# Patient Record
Sex: Male | Born: 1950
Health system: Southern US, Community
[De-identification: ages and names within clinical notes are randomized; demographics above are authoritative.]

## PROBLEM LIST (undated history)

## (undated) DIAGNOSIS — Z8371 Family history of colonic polyps: Secondary | ICD-10-CM

## (undated) DIAGNOSIS — Z83719 Family history of colon polyps, unspecified: Secondary | ICD-10-CM

## (undated) DIAGNOSIS — H409 Unspecified glaucoma: Secondary | ICD-10-CM

## (undated) DIAGNOSIS — S42309A Unspecified fracture of shaft of humerus, unspecified arm, initial encounter for closed fracture: Secondary | ICD-10-CM

## (undated) DIAGNOSIS — E785 Hyperlipidemia, unspecified: Secondary | ICD-10-CM

## (undated) DIAGNOSIS — H269 Unspecified cataract: Secondary | ICD-10-CM

## (undated) DIAGNOSIS — C4359 Malignant melanoma of other part of trunk: Secondary | ICD-10-CM

## (undated) HISTORY — DX: Unspecified glaucoma: H40.9

## (undated) HISTORY — DX: Malignant melanoma of other part of trunk: C43.59

## (undated) HISTORY — DX: Family history of colonic polyps: Z83.71

## (undated) HISTORY — DX: Unspecified cataract: H26.9

## (undated) HISTORY — DX: Unspecified fracture of shaft of humerus, unspecified arm, initial encounter for closed fracture: S42.309A

## (undated) HISTORY — DX: Family history of colon polyps, unspecified: Z83.719

## (undated) HISTORY — PX: COLONOSCOPY: SHX174

## (undated) HISTORY — DX: Hyperlipidemia, unspecified: E78.5

---

## 1983-06-15 HISTORY — PX: SHOULDER SURGERY: SHX246

## 1988-06-14 DIAGNOSIS — S42309A Unspecified fracture of shaft of humerus, unspecified arm, initial encounter for closed fracture: Secondary | ICD-10-CM

## 1988-06-14 HISTORY — DX: Unspecified fracture of shaft of humerus, unspecified arm, initial encounter for closed fracture: S42.309A

## 2002-02-22 ENCOUNTER — Emergency Department (HOSPITAL_COMMUNITY): Admission: EM | Admit: 2002-02-22 | Discharge: 2002-02-22 | Payer: Self-pay | Admitting: Emergency Medicine

## 2002-02-22 ENCOUNTER — Encounter: Payer: Self-pay | Admitting: Emergency Medicine

## 2004-07-03 ENCOUNTER — Ambulatory Visit: Payer: Self-pay | Admitting: Family Medicine

## 2005-07-08 ENCOUNTER — Ambulatory Visit: Payer: Self-pay | Admitting: Internal Medicine

## 2007-01-04 DIAGNOSIS — Z860100 Personal history of colon polyps, unspecified: Secondary | ICD-10-CM | POA: Insufficient documentation

## 2007-01-04 DIAGNOSIS — E785 Hyperlipidemia, unspecified: Secondary | ICD-10-CM

## 2007-01-10 ENCOUNTER — Ambulatory Visit: Payer: Self-pay | Admitting: Internal Medicine

## 2007-01-10 LAB — CONVERTED CEMR LAB
ALT: 26 units/L (ref 0–53)
Glucose, Bld: 100 mg/dL — ABNORMAL HIGH (ref 70–99)
HDL: 60.9 mg/dL (ref >39.0)
Total CHOL/HDL Ratio: 3.9
Triglycerides: 306 mg/dL (ref 0–149)

## 2008-01-16 ENCOUNTER — Telehealth (INDEPENDENT_AMBULATORY_CARE_PROVIDER_SITE_OTHER): Payer: Self-pay | Admitting: *Deleted

## 2008-06-05 ENCOUNTER — Ambulatory Visit: Payer: Self-pay | Admitting: Internal Medicine

## 2008-06-05 DIAGNOSIS — L570 Actinic keratosis: Secondary | ICD-10-CM

## 2008-06-06 LAB — CONVERTED CEMR LAB
ALT: 25 units/L (ref 0–53)
AST: 25 units/L (ref 0–37)
BUN: 11 mg/dL (ref 6–23)
Bilirubin, Direct: 0.1 mg/dL (ref 0.0–0.3)
Calcium: 9.5 mg/dL (ref 8.4–10.5)
Cholesterol: 205 mg/dL (ref 0–200)
Creatinine, Ser: 0.8 mg/dL (ref 0.4–1.5)
Eosinophils Relative: 5.7 % — ABNORMAL HIGH (ref 0.0–5.0)
GFR calc Af Amer: 128 mL/min
Glucose, Bld: 108 mg/dL — ABNORMAL HIGH (ref 70–99)
HCT: 39.6 % (ref 39.0–52.0)
Hemoglobin: 14.1 g/dL (ref 13.0–17.0)
Lymphocytes Relative: 20.2 % (ref 12.0–46.0)
Monocytes Relative: 6.5 % (ref 3.0–12.0)
Neutro Abs: 4.5 10*3/uL (ref 1.4–7.7)
Phosphorus: 3.8 mg/dL (ref 2.3–4.6)
Platelets: 245 10*3/uL (ref 150–400)
RDW: 12.2 % (ref 11.5–14.6)
Total Bilirubin: 0.8 mg/dL (ref 0.3–1.2)
Total CHOL/HDL Ratio: 3.2
Total Protein: 6.9 g/dL (ref 6.0–8.3)
VLDL: 36 mg/dL (ref 0–40)
WBC: 6.8 10*3/uL (ref 4.5–10.5)

## 2008-06-25 ENCOUNTER — Ambulatory Visit: Payer: Self-pay | Admitting: Gastroenterology

## 2008-07-08 ENCOUNTER — Encounter: Payer: Self-pay | Admitting: Gastroenterology

## 2008-07-08 ENCOUNTER — Ambulatory Visit: Payer: Self-pay | Admitting: Gastroenterology

## 2008-07-09 ENCOUNTER — Encounter: Payer: Self-pay | Admitting: Gastroenterology

## 2009-07-29 ENCOUNTER — Ambulatory Visit: Payer: Self-pay | Admitting: Internal Medicine

## 2009-07-31 LAB — CONVERTED CEMR LAB
AST: 33 units/L (ref 0–37)
BUN: 9 mg/dL (ref 6–23)
Basophils Absolute: 0 10*3/uL (ref 0.0–0.1)
CO2: 30 meq/L (ref 19–32)
Cholesterol: 221 mg/dL — ABNORMAL HIGH (ref 0–200)
Creatinine, Ser: 0.8 mg/dL (ref 0.4–1.5)
Direct LDL: 118.1 mg/dL
GFR calc non Af Amer: 105.38 mL/min (ref >60)
Glucose, Bld: 106 mg/dL — ABNORMAL HIGH (ref 70–99)
HCT: 41.7 % (ref 39.0–52.0)
HDL: 87.1 mg/dL (ref >39.00)
Lymphs Abs: 1.6 10*3/uL (ref 0.7–4.0)
Monocytes Relative: 5.8 % (ref 3.0–12.0)
Neutrophils Relative %: 60.7 % (ref 43.0–77.0)
Phosphorus: 4.2 mg/dL (ref 2.3–4.6)
Platelets: 256 10*3/uL (ref 150.0–400.0)
RDW: 12.8 % (ref 11.5–14.6)
Total Bilirubin: 0.9 mg/dL (ref 0.3–1.2)
Total CHOL/HDL Ratio: 3
Triglycerides: 141 mg/dL (ref 0.0–149.0)
VLDL: 28.2 mg/dL (ref 0.0–40.0)

## 2009-11-28 ENCOUNTER — Ambulatory Visit: Payer: Self-pay | Admitting: Internal Medicine

## 2009-11-28 DIAGNOSIS — M109 Gout, unspecified: Secondary | ICD-10-CM | POA: Insufficient documentation

## 2009-11-29 ENCOUNTER — Ambulatory Visit: Payer: Self-pay | Admitting: Family Medicine

## 2009-11-30 LAB — CONVERTED CEMR LAB
BUN: 13 mg/dL (ref 6–23)
CO2: 28 meq/L (ref 19–32)
Chloride: 105 meq/L (ref 96–112)
GFR calc non Af Amer: 99.5 mL/min (ref >60)
Sodium: 141 meq/L (ref 135–145)
Uric Acid, Serum: 7.4 mg/dL (ref 4.0–7.8)

## 2009-12-01 ENCOUNTER — Telehealth: Payer: Self-pay | Admitting: Internal Medicine
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
## 2010-07-14 NOTE — Assessment & Plan Note (Signed)
Summary: GOUT NOT GETTING BETTER FROM THE MEDS GIVEN 6.17.11/[facility]   Vital Signs:  Patient profile:   60 year old male Weight:      182 pounds Temp:     97.3 degrees F oral Pulse rate:   72 / minute BP sitting:   120 / 82  (left arm)  Vitals Entered By: Doristine Devoid (November 29, 2009 10:18 AM) CC: gout no better in L big toe   CC:  gout no better in L big toe.  History of Present Illness: 60 y/o male dx by dr. Williemae Natter yest. to have acute gout.ist episode...Marland KitchenMarland KitchenMarland Kitchen colcrys .6 three times a day .Marland Kitchen here today b/ it is not 100 % imp.and he has a Cytogeneticist !!!!!!!!!!!!!!!!!!!!!!!!!!!  Allergies: 1)  Flexeril (Cyclobenzaprine Hcl)  Review of Systems      See HPI  Physical Exam  General:  Well-developed,well-nourished,in no acute distress; alert,appropriate and cooperative throughout examination Msk:  red,swollen toe   Impression & Recommendations:  Problem # 1:  GOUT (ICD-274.9) Assessment Improved  His updated medication list for this problem includes:    Colcrys 0.6 Mg Tabs (Colchicine) .Marland Kitchen... 1 tab every 2 hours up to 3 daily for gout attacks  Complete Medication List: 1)  Lipitor 40 Mg Tabs (Atorvastatin calcium) .... Take 1/2 tablet daily 2)  Multivitamins Tabs (Multiple vitamin) .... Take one by mouth once a day 3)  Aspirin 81 Mg Tbec (Aspirin) .... Take one by mouth once a day 4)  Colcrys 0.6 Mg Tabs (Colchicine) .Marland Kitchen.. 1 tab every 2 hours up to 3 daily for gout attacks  Patient Instructions: 1)  elevation/ice today and sun. cont. rx....see pcp as needed..........................Marland Kitchenuric acid level ??????????
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
## 2010-07-14 NOTE — Progress Notes (Signed)
Summary: call a nurse  Phone Note Call from Patient   Summary of Call: Triage Record Num: [id number] Operator: Karenann Cai Patient Name: Navarre Diana Call Date & Time: 11/29/2009 8:42:34AM Patient Phone: [phone] PCP: Tillman Abide Patient Gender: Male PCP Fax : Patient DOB: 1950/08/28 Practice Name: [facility] Reason for Call: Patient is calling to report that patient was seen on 11/28/2009: gout in left foot. Patient has taken 3 doses and no change in symptoms. Patient reports that his symptoms are not improving. RN contacted staff at [facility] to schedule an appointment for today: appointment scheduled for 10:15. RN contacted patient with this appointment time. Protocol(s) Used: Office Note Recommended Outcome per Protocol: Information Noted and Sent to Office Reason for Outcome: Caller information to office Care Advice:  ~ 06/ Initial call taken by: Melody Comas,  December 01, 2009 10:22 AM

## 2010-08-13 HISTORY — PX: CATARACT EXTRACTION W/ INTRAOCULAR LENS  IMPLANT, BILATERAL: SHX1307

## 2010-11-20 ENCOUNTER — Other Ambulatory Visit: Payer: Self-pay | Admitting: *Deleted

## 2010-11-20 MED ORDER — ATORVASTATIN CALCIUM 40 MG PO TABS: ORAL_TABLET | ORAL | Status: DC

## 2010-11-25 ENCOUNTER — Telehealth: Payer: Self-pay | Admitting: *Deleted

## 2010-11-25 NOTE — Telephone Encounter (Signed)
Pt is angry and upset regarding his lipitor script.  It was sent in with directions to take one half a day, he says it has always been sent in as one and a half a day.  I asked pt how he takes it and he said he takes one half a day, which are the directions in the chart.  He says he has to pay twice as much unless the directions call for one and a half.  I told him that we cant call the script in that way unless the directions are changed in the chart, that would be fraud.  He wants [place] to do something to get this back to the way it was.  Please advise.

## 2010-11-25 NOTE — Telephone Encounter (Signed)
Your response is correct It would be fraudulent for [place] to purposefully falsify his prescription to save him money I will ask Johnathan Lambert So to deal with this

## 2011-01-27 ENCOUNTER — Encounter: Payer: Self-pay | Admitting: Internal Medicine

## 2011-01-28 ENCOUNTER — Encounter: Payer: Self-pay | Admitting: Internal Medicine

## 2011-01-28 ENCOUNTER — Ambulatory Visit (INDEPENDENT_AMBULATORY_CARE_PROVIDER_SITE_OTHER): Payer: BC Managed Care – PPO | Admitting: Internal Medicine

## 2011-01-28 DIAGNOSIS — B079 Viral wart, unspecified: Secondary | ICD-10-CM

## 2011-01-28 DIAGNOSIS — I498 Other specified cardiac arrhythmias: Secondary | ICD-10-CM

## 2011-01-28 DIAGNOSIS — M109 Gout, unspecified: Secondary | ICD-10-CM

## 2011-01-28 DIAGNOSIS — L570 Actinic keratosis: Secondary | ICD-10-CM

## 2011-01-28 DIAGNOSIS — E785 Hyperlipidemia, unspecified: Secondary | ICD-10-CM

## 2011-01-28 DIAGNOSIS — Z0000 Encounter for general adult medical examination without abnormal findings: Secondary | ICD-10-CM

## 2011-01-28 NOTE — Assessment & Plan Note (Signed)
Has been quiet Will check uric acid level

## 2011-01-28 NOTE — Telephone Encounter (Signed)
Response to patient is accurate and documented.  Medication must follow physician's directions as prescribed.

## 2011-01-28 NOTE — Assessment & Plan Note (Signed)
Liquid nitrogen to wart about 60 seconds x 2  Discussed home care

## 2011-01-28 NOTE — Assessment & Plan Note (Signed)
Liquid nitrogen to scalp lesions for about 20 seconds x 2 Tolerated well

## 2011-01-28 NOTE — Assessment & Plan Note (Signed)
Will check labs

## 2011-01-28 NOTE — Assessment & Plan Note (Signed)
EKG shows sinus bradycardia with arrhythmia actually He has always had slow heart rate Nothing of concern
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
## 2011-01-28 NOTE — Assessment & Plan Note (Signed)
Fairly healthy Stays active though not that aerobic in some years Will check PSA after discussion

## 2011-01-29 LAB — LDL CHOLESTEROL, DIRECT: Direct LDL: 113 mg/dL

## 2011-01-29 LAB — CBC WITH DIFFERENTIAL/PLATELET
Basophils Absolute: 0.1 10*3/uL (ref 0.0–0.1)
HCT: 40.9 % (ref 39.0–52.0)
Hemoglobin: 13.8 g/dL (ref 13.0–17.0)
Lymphs Abs: 1.7 10*3/uL (ref 0.7–4.0)
MCHC: 33.8 g/dL (ref 30.0–36.0)
MCV: 95.1 fl (ref 78.0–100.0)
Monocytes Absolute: 0.3 10*3/uL (ref 0.1–1.0)
Monocytes Relative: 4.3 % (ref 3.0–12.0)
Neutro Abs: 5 10*3/uL (ref 1.4–7.7)
RDW: 13.4 % (ref 11.5–14.6)

## 2011-01-29 LAB — BASIC METABOLIC PANEL
CO2: 30 mEq/L (ref 19–32)
Chloride: 103 mEq/L (ref 96–112)
GFR: 118.4 mL/min (ref >60.00)
Glucose, Bld: 93 mg/dL (ref 70–99)
Potassium: 4 mEq/L (ref 3.5–5.1)
Sodium: 142 mEq/L (ref 135–145)

## 2011-01-29 LAB — HEPATIC FUNCTION PANEL
Albumin: 4.6 g/dL (ref 3.5–5.2)
Alkaline Phosphatase: 67 U/L (ref 39–117)
Total Protein: 7.5 g/dL (ref 6.0–8.3)

## 2011-01-29 LAB — LIPID PANEL
Cholesterol: 210 mg/dL — ABNORMAL HIGH (ref 0–200)
Triglycerides: 212 mg/dL — ABNORMAL HIGH (ref 0.0–149.0)

## 2011-06-10 ENCOUNTER — Telehealth: Payer: Self-pay | Admitting: Internal Medicine

## 2011-06-10 ENCOUNTER — Ambulatory Visit (INDEPENDENT_AMBULATORY_CARE_PROVIDER_SITE_OTHER): Payer: BC Managed Care – PPO | Admitting: Internal Medicine

## 2011-06-10 ENCOUNTER — Encounter: Payer: Self-pay | Admitting: Internal Medicine

## 2011-06-10 VITALS — BP 110/60 | HR 62 | Temp 98.5°F | Ht 69.0 in | Wt 189.0 lb

## 2011-06-10 DIAGNOSIS — S335XXA Sprain of ligaments of lumbar spine, initial encounter: Secondary | ICD-10-CM | POA: Insufficient documentation

## 2011-06-10 MED ORDER — CYCLOBENZAPRINE HCL 10 MG PO TABS: 5.0000 mg | ORAL_TABLET | Freq: Every evening | ORAL | Status: AC | PRN

## 2011-06-10 NOTE — Telephone Encounter (Signed)
Patient scheduled at 430pm

## 2011-06-10 NOTE — Assessment & Plan Note (Signed)
Clearly seems to be muscle sprain No worrisome neuro findings or bone tenderness Reassured Can continue ibuprofen and heat Ice if repeated sprain Will try cyclobenzaprine at night prn

## 2011-06-10 NOTE — Telephone Encounter (Signed)
Patient called and stated he would be in [place] this afternoon around 4:30 and wanted to know if you could see him for his back problems.  He stated he knows it's short notice but he would appreciate it.

## 2011-06-10 NOTE — Progress Notes (Signed)
  Subjective:    Patient ID: Johnathan Lambert, male    DOB: [birth year], 60 y.o.   MRN: [id number]  HPI Hasn't had back problems for a couple of years Saw a chiropractor back 3 years ago or so---felt the realignment helped then  Noted problems in the past 2 months No known injury Went back to chiropractor 5-6 times--not helpful and "just tightened up" He did not do x-rays  Stiff esp in AM---has used ice and heat Stiffness does ease up as the day goes on  Has used some ibuprofen -- up to 600mg  tid. This seems to help after a while  Pain is in center just above hips No sig radiation No leg weakness  Current Outpatient Prescriptions on File Prior to Visit  Medication Sig Dispense Refill  . aspirin 81 MG tablet Take 81 mg by mouth daily.        Marland Kitchen atorvastatin (LIPITOR) 40 MG tablet Take 1/2 tablet daily  30 tablet  3  . Multiple Vitamin (MULTIVITAMIN) tablet Take 1 tablet by mouth daily.          Allergies  Allergen Reactions  . Cyclobenzaprine Hcl     REACTION: Nightmares    Past Medical History  Diagnosis Date  . FH: colonic polyps   . Hyperlipidemia   . Gout   . Broken arm 1990    cast    Past Surgical History  Procedure Date  . Shoulder surgery 1985    Right  . Cataract extraction w/ intraocular lens  implant, bilateral 3/12    Family History  Problem Relation Age of Onset  . Hyperlipidemia Mother   . Hyperthyroidism Mother   . Heart disease Father 90    MI and CAD  . Hyperthyroidism Sister   . Hyperthyroidism Brother   . Cancer Paternal Uncle     lung    History   Social History  . Marital Status: Married    Spouse Name: N/A    Number of Children: 2  . Years of Education: N/A   Occupational History  . Medical sales representative business.     Social History Main Topics  . Smoking status: Never Smoker   . Smokeless tobacco: Never Used  . Alcohol Use: 0.0 oz/week     Occassionally  . Drug Use: Not on file  . Sexually Active: Not on file   Other  Topics Concern  . 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 No distress.  Musculoskeletal:       Mild tenderness and tension along lumbar paraspinals No clear spine tenderness Normal ROM in hips SLR negative Normal and complete flexion  Neurological:       Normal strength in legs and gait          Assessment & Plan:

## 2011-06-10 NOTE — Patient Instructions (Signed)
Continue the ibuprofen up to 600mg  three times a day Use heat unless you pull your back again---then use ice intermittently for 4-8 hours Use the muscle relaxer at night if you are waking up stiff  Low Back Sprain with Rehab  A sprain is an injury in which a ligament is torn. The ligaments of the lower back are vulnerable to sprains. However, they are strong and require great force to be injured. These ligaments are important for stabilizing the spinal column. Sprains are classified into three categories. Grade 1 sprains cause pain, but the tendon is not lengthened. Grade 2 sprains include a lengthened ligament, due to the ligament being stretched or partially ruptured. With grade 2 sprains there is still function, although the function may be decreased. Grade 3 sprains involve a complete tear of the tendon or muscle, and function is usually impaired. SYMPTOMS   Severe pain in the lower back.   Sometimes, a feeling of a "pop," "snap," or tear, at the time of injury.   Tenderness and sometimes swelling at the injury site.   Uncommonly, bruising (contusion) within 48 hours of injury.   Muscle spasms in the back.  CAUSES  Low back sprains occur when a force is placed on the ligaments that is greater than they can handle. Common causes of injury include:  Performing a stressful act while off-balance.   Repetitive stressful activities that involve movement of the lower back.   Direct hit (trauma) to the lower back.  RISK INCREASES WITH:  Contact sports (football, wrestling).   Collisions (major skiing accidents).   Sports that require throwing or lifting (baseball, weightlifting).   Sports involving twisting of the spine (gymnastics, diving, tennis, golf).   Poor strength and flexibility.   Inadequate protection.   Previous back injury or surgery (especially fusion).  PREVENTION  Wear properly fitted and padded protective equipment.   Warm up and stretch properly before  activity.   Allow for adequate recovery between workouts.   Maintain physical fitness:   Strength, flexibility, and endurance.   Cardiovascular fitness.   Maintain a healthy body weight.  PROGNOSIS  If treated properly, low back sprains usually heal with non-surgical treatment. The length of time for healing depends on the severity of the injury.  RELATED COMPLICATIONS   Recurring symptoms, resulting in a chronic problem.   Chronic inflammation and pain in the low back.   Delayed healing or resolution of symptoms, especially if activity is resumed too soon.   Prolonged impairment.   Unstable or arthritic joints of the low back.  TREATMENT  Treatment first involves the use of ice and medicine, to reduce pain and inflammation. The use of strengthening and stretching exercises may help reduce pain with activity. These exercises may be performed at home or with a therapist. Severe injuries may require referral to a therapist for further evaluation and treatment, such as ultrasound. Your caregiver may advise that you wear a back brace or corset, to help reduce pain and discomfort. Often, prolonged bed rest results in greater harm then benefit. Corticosteroid injections may be recommended. However, these should be reserved for the most serious cases. It is important to avoid using your back when lifting objects. At night, sleep on your back on a firm mattress, with a pillow placed under your knees. If non-surgical treatment is unsuccessful, surgery may be needed.  MEDICATION   If pain medicine is needed, nonsteroidal anti-inflammatory medicines (aspirin and ibuprofen), or other minor pain relievers (acetaminophen), are often advised.  Do not take pain medicine for 7 days before surgery.   Prescription pain relievers may be given, if your caregiver thinks they are needed. Use only as directed and only as much as you need.   Ointments applied to the skin may be helpful.   Corticosteroid  injections may be given by your caregiver. These injections should be reserved for the most serious cases, because they may only be given a certain number of times.  HEAT AND COLD  Cold treatment (icing) should be applied for 10 to 15 minutes every 2 to 3 hours for inflammation and pain, and immediately after activity that aggravates your symptoms. Use ice packs or an ice massage.   Heat treatment may be used before performing stretching and strengthening activities prescribed by your caregiver, physical therapist, or athletic trainer. Use a heat pack or a warm water soak.  SEEK MEDICAL CARE IF:   Symptoms get worse or do not improve in 2 to 4 weeks, despite treatment.   You develop numbness or weakness in either leg.   You lose bowel or bladder function.   Any of the following occur after surgery: fever, increased pain, swelling, redness, drainage of fluids, or bleeding in the affected area.   New, unexplained symptoms develop. (Drugs used in treatment may produce side effects.)  EXERCISES  RANGE OF MOTION (ROM) AND STRETCHING EXERCISES - Low Back Sprain Most people with lower back pain will find that their symptoms get worse with excessive bending forward (flexion) or arching at the lower back (extension). The exercises that will help resolve your symptoms will focus on the opposite motion.  Your physician, physical therapist or athletic trainer will help you determine which exercises will be most helpful to resolve your lower back pain. Do not complete any exercises without first consulting with your caregiver. Discontinue any exercises which make your symptoms worse, until you speak to your caregiver. If you have pain, numbness or tingling which travels down into your buttocks, leg or foot, the goal of the therapy is for these symptoms to move closer to your back and eventually resolve. Sometimes, these leg symptoms will get better, but your lower back pain may worsen. This is often an  indication of progress in your rehabilitation. Be very alert to any changes in your symptoms and the activities in which you participated in the 24 hours prior to the change. Sharing this information with your caregiver will allow him or her to most efficiently treat your condition. These exercises may help you when beginning to rehabilitate your injury. Your symptoms may resolve with or without further involvement from your physician, physical therapist or athletic trainer. While completing these exercises, remember:   Restoring tissue flexibility helps normal motion to return to the joints. This allows healthier, less painful movement and activity.   An effective stretch should be held for at least 30 seconds.   A stretch should never be painful. You should only feel a gentle lengthening or release in the stretched tissue.  FLEXION RANGE OF MOTION AND STRETCHING EXERCISES: STRETCH - Flexion, Single Knee to Chest   Lie on a firm bed or floor with both legs extended in front of you.   Keeping one leg in contact with the floor, bring your opposite knee to your chest. Hold your leg in place by either grabbing behind your thigh or at your knee.   Pull until you feel a gentle stretch in your low back. Hold __________ seconds.   Slowly release your  grasp and repeat the exercise with the opposite side.  Repeat __________ times. Complete this exercise __________ times per day.  STRETCH - Flexion, Double Knee to Chest  Lie on a firm bed or floor with both legs extended in front of you.   Keeping one leg in contact with the floor, bring your opposite knee to your chest.   Tense your stomach muscles to support your back and then lift your other knee to your chest. Hold your legs in place by either grabbing behind your thighs or at your knees.   Pull both knees toward your chest until you feel a gentle stretch in your low back. Hold __________ seconds.   Tense your stomach muscles and slowly return  one leg at a time to the floor.  Repeat __________ times. Complete this exercise __________ times per day.  STRETCH - Low Trunk Rotation  Lie on a firm bed or floor. Keeping your legs in front of you, bend your knees so they are both pointed toward the ceiling and your feet are flat on the floor.   Extend your arms out to the side. This will stabilize your upper body by keeping your shoulders in contact with the floor.   Gently and slowly drop both knees together to one side until you feel a gentle stretch in your low back. Hold for __________ seconds.   Tense your stomach muscles to support your lower back as you bring your knees back to the starting position. Repeat the exercise to the other side.  Repeat __________ times. Complete this exercise __________ times per day  EXTENSION RANGE OF MOTION AND FLEXIBILITY EXERCISES: STRETCH - Extension, Prone on Elbows   Lie on your stomach on the floor, a bed will be too soft. Place your palms about shoulder width apart and at the height of your head.   Place your elbows under your shoulders. If this is too painful, stack pillows under your chest.   Allow your body to relax so that your hips drop lower and make contact more completely with the floor.   Hold this position for __________ seconds.   Slowly return to lying flat on the floor.  Repeat __________ times. Complete this exercise __________ times per day.  RANGE OF MOTION - Extension, Prone Press Ups  Lie on your stomach on the floor, a bed will be too soft. Place your palms about shoulder width apart and at the height of your head.   Keeping your back as relaxed as possible, slowly straighten your elbows while keeping your hips on the floor. You may adjust the placement of your hands to maximize your comfort. As you gain motion, your hands will come more underneath your shoulders.   Hold this position __________ seconds.   Slowly return to lying flat on the floor.  Repeat __________  times. Complete this exercise __________ times per day.  RANGE OF MOTION- Quadruped, Neutral Spine   Assume a hands and knees position on a firm surface. Keep your hands under your shoulders and your knees under your hips. You may place padding under your knees for comfort.   Drop your head and point your tailbone toward the ground below you. This will round out your lower back like an angry cat. Hold this position for __________ seconds.   Slowly lift your head and release your tail bone so that your back sags into a large arch, like an old horse.   Hold this position for __________ seconds.  Repeat this until you feel limber in your low back.   Now, find your "sweet spot." This will be the most comfortable position somewhere between the two previous positions. This is your neutral spine. Once you have found this position, tense your stomach muscles to support your low back.   Hold this position for __________ seconds.  Repeat __________ times. Complete this exercise __________ times per day.  STRENGTHENING EXERCISES - Low Back Sprain These exercises may help you when beginning to rehabilitate your injury. These exercises should be done near your "sweet spot." This is the neutral, low-back arch, somewhere between fully rounded and fully arched, that is your least painful position. When performed in this safe range of motion, these exercises can be used for people who have either a flexion or extension based injury. These exercises may resolve your symptoms with or without further involvement from your physician, physical therapist or athletic trainer. While completing these exercises, remember:   Muscles can gain both the endurance and the strength needed for everyday activities through controlled exercises.   Complete these exercises as instructed by your physician, physical therapist or athletic trainer. Increase the resistance and repetitions only as guided.   You may experience muscle  soreness or fatigue, but the pain or discomfort you are trying to eliminate should never worsen during these exercises. If this pain does worsen, stop and make certain you are following the directions exactly. If the pain is still present after adjustments, discontinue the exercise until you can discuss the trouble with your caregiver.  STRENGTHENING - Deep Abdominals, Pelvic Tilt   Lie on a firm bed or floor. Keeping your legs in front of you, bend your knees so they are both pointed toward the ceiling and your feet are flat on the floor.   Tense your lower abdominal muscles to press your low back into the floor. This motion will rotate your pelvis so that your tail bone is scooping upwards rather than pointing at your feet or into the floor.  With a gentle tension and even breathing, hold this position for __________ seconds. Repeat __________ times. Complete this exercise __________ times per day.  STRENGTHENING - Abdominals, Crunches   Lie on a firm bed or floor. Keeping your legs in front of you, bend your knees so they are both pointed toward the ceiling and your feet are flat on the floor. Cross your arms over your chest.   Slightly tip your chin down without bending your neck.   Tense your abdominals and slowly lift your trunk high enough to just clear your shoulder blades. Lifting higher can put excessive stress on the lower back and does not further strengthen your abdominal muscles.   Control your return to the starting position.  Repeat __________ times. Complete this exercise __________ times per day.  STRENGTHENING - Quadruped, Opposite UE/LE Lift   Assume a hands and knees position on a firm surface. Keep your hands under your shoulders and your knees under your hips. You may place padding under your knees for comfort.   Find your neutral spine and gently tense your abdominal muscles so that you can maintain this position. Your shoulders and hips should form a rectangle that is  parallel with the floor and is not twisted.   Keeping your trunk steady, lift your right hand no higher than your shoulder and then your left leg no higher than your hip. Make sure you are not holding your breath. Hold this position for __________ seconds.  Continuing to keep your abdominal muscles tense and your back steady, slowly return to your starting position. Repeat with the opposite arm and leg.  Repeat __________ times. Complete this exercise __________ times per day.  STRENGTHENING - Abdominals and Quadriceps, Straight Leg Raise   Lie on a firm bed or floor with both legs extended in front of you.   Keeping one leg in contact with the floor, bend the other knee so that your foot can rest flat on the floor.   Find your neutral spine, and tense your abdominal muscles to maintain your spinal position throughout the exercise.   Slowly lift your straight leg off the floor about 6 inches for a count of 15, making sure to not hold your breath.   Still keeping your neutral spine, slowly lower your leg all the way to the floor.  Repeat this exercise with each leg __________ times. Complete this exercise __________ times per day. POSTURE AND BODY MECHANICS CONSIDERATIONS - Low Back Sprain Keeping correct posture when sitting, standing or completing your activities will reduce the stress put on different body tissues, allowing injured tissues a chance to heal and limiting painful experiences. The following are general guidelines for improved posture. Your physician or physical therapist will provide you with any instructions specific to your needs. While reading these guidelines, remember:  The exercises prescribed by your provider will help you have the flexibility and strength to maintain correct postures.   The correct posture provides the best environment for your joints to work. All of your joints have less wear and tear when properly supported by a spine with good posture. This means you  will experience a healthier, less painful body.   Correct posture must be practiced with all of your activities, especially prolonged sitting and standing. Correct posture is as important when doing repetitive low-stress activities (typing) as it is when doing a single heavy-load activity (lifting).  RESTING POSITIONS Consider which positions are most painful for you when choosing a resting position. If you have pain with flexion-based activities (sitting, bending, stooping, squatting), choose a position that allows you to rest in a less flexed posture. You would want to avoid curling into a fetal position on your side. If your pain worsens with extension-based activities (prolonged standing, working overhead), avoid resting in an extended position such as sleeping on your stomach. Most people will find more comfort when they rest with their spine in a more neutral position, neither too rounded nor too arched. Lying on a non-sagging bed on your side with a pillow between your knees, or on your back with a pillow under your knees will often provide some relief. Keep in mind, being in any one position for a prolonged period of time, no matter how correct your posture, can still lead to stiffness. PROPER SITTING POSTURE In order to minimize stress and discomfort on your spine, you must sit with correct posture. Sitting with good posture should be effortless for a healthy body. Returning to good posture is a gradual process. Many people can work toward this most comfortably by using various supports until they have the flexibility and strength to maintain this posture on their own. When sitting with proper posture, your ears will fall over your shoulders and your shoulders will fall over your hips. You should use the back of the chair to support your upper back. Your lower back will be in a neutral position, just slightly arched. You may place a small pillow or folded towel  at the base of your lower back for    support.  When working at a desk, create an environment that supports good, upright posture. Without extra support, muscles tire, which leads to excessive strain on joints and other tissues. Keep these recommendations in mind: CHAIR:  A chair should be able to slide under your desk when your back makes contact with the back of the chair. This allows you to work closely.   The chair's height should allow your eyes to be level with the upper part of your monitor and your hands to be slightly lower than your elbows.  BODY POSITION  Your feet should make contact with the floor. If this is not possible, use a foot rest.   Keep your ears over your shoulders. This will reduce stress on your neck and low back.  INCORRECT SITTING POSTURES  If you are feeling tired and unable to assume a healthy sitting posture, do not slouch or slump. This puts excessive strain on your back tissues, causing more damage and pain. Healthier options include:  Using more support, like a lumbar pillow.   Switching tasks to something that requires you to be upright or walking.   Talking a brief walk.   Lying down to rest in a neutral-spine position.  PROLONGED STANDING WHILE SLIGHTLY LEANING FORWARD  When completing a task that requires you to lean forward while standing in one place for a long time, place either foot up on a stationary 2-4 inch high object to help maintain the best posture. When both feet are on the ground, the lower back tends to lose its slight inward curve. If this curve flattens (or becomes too large), then the back and your other joints will experience too much stress, tire more quickly, and can cause pain. CORRECT STANDING POSTURES Proper standing posture should be assumed with all daily activities, even if they only take a few moments, like when brushing your teeth. As in sitting, your ears should fall over your shoulders and your shoulders should fall over your hips. You should keep a slight  tension in your abdominal muscles to brace your spine. Your tailbone should point down to the ground, not behind your body, resulting in an over-extended swayback posture.  INCORRECT STANDING POSTURES  Common incorrect standing postures include a forward head, locked knees and/or an excessive swayback. WALKING Walk with an upright posture. Your ears, shoulders and hips should all line-up. PROLONGED ACTIVITY IN A FLEXED POSITION When completing a task that requires you to bend forward at your waist or lean over a low surface, try to find a way to stabilize 3 out of 4 of your limbs. You can place a hand or elbow on your thigh or rest a knee on the surface you are reaching across. This will provide you more stability, so that your muscles do not tire as quickly. By keeping your knees relaxed, or slightly bent, you will also reduce stress across your lower back. CORRECT LIFTING TECHNIQUES DO :  Assume a wide stance. This will provide you more stability and the opportunity to get as close as possible to the object which you are lifting.   Tense your abdominals to brace your spine. Bend at the knees and hips. Keeping your back locked in a neutral-spine position, lift using your leg muscles. Lift with your legs, keeping your back straight.   Test the weight of unknown objects before attempting to lift them.   Try to keep your elbows locked down  at your sides in order get the best strength from your shoulders when carrying an object.   Always ask for help when lifting heavy or awkward objects.  INCORRECT LIFTING TECHNIQUES DO NOT:   Lock your knees when lifting, even if it is a small object.   Bend and twist. Pivot at your feet or move your feet when needing to change directions.   Assume that you can safely pick up even a paperclip without proper posture.  Document Released: 05/31/2005 Document Revised: 02/10/2011 Document Reviewed: 09/12/2008 [facility] Patient Information 2012 [place], [place].

## 2011-06-10 NOTE — Telephone Encounter (Signed)
Okay to add on at 4:30PM

## 2011-06-11 ENCOUNTER — Ambulatory Visit: Payer: BC Managed Care – PPO | Admitting: Internal Medicine

## 2011-07-21 ENCOUNTER — Other Ambulatory Visit: Payer: Self-pay | Admitting: Internal Medicine

## 2012-01-24 ENCOUNTER — Other Ambulatory Visit: Payer: Self-pay | Admitting: *Deleted

## 2012-01-24 MED ORDER — ATORVASTATIN CALCIUM 40 MG PO TABS: 20.0000 mg | ORAL_TABLET | Freq: Every day | ORAL | Status: DC

## 2012-02-01 ENCOUNTER — Encounter: Payer: Self-pay | Admitting: Internal Medicine

## 2012-02-01 ENCOUNTER — Encounter: Payer: Self-pay | Admitting: *Deleted

## 2012-02-01 ENCOUNTER — Ambulatory Visit (INDEPENDENT_AMBULATORY_CARE_PROVIDER_SITE_OTHER): Payer: BC Managed Care – PPO | Admitting: Internal Medicine

## 2012-02-01 VITALS — BP 112/78 | HR 57 | Temp 98.4°F | Ht 69.0 in | Wt 179.0 lb

## 2012-02-01 DIAGNOSIS — Z0000 Encounter for general adult medical examination without abnormal findings: Secondary | ICD-10-CM

## 2012-02-01 DIAGNOSIS — E785 Hyperlipidemia, unspecified: Secondary | ICD-10-CM

## 2012-02-01 LAB — CBC WITH DIFFERENTIAL/PLATELET
Basophils Relative: 0.7 % (ref 0.0–3.0)
Eosinophils Absolute: 0.3 10*3/uL (ref 0.0–0.7)
Hemoglobin: 13.2 g/dL (ref 13.0–17.0)
MCHC: 33.7 g/dL (ref 30.0–36.0)
MCV: 94.3 fl (ref 78.0–100.0)
Monocytes Absolute: 0.3 10*3/uL (ref 0.1–1.0)
Neutro Abs: 3.1 10*3/uL (ref 1.4–7.7)
Neutrophils Relative %: 60.9 % (ref 43.0–77.0)
RBC: 4.16 Mil/uL — ABNORMAL LOW (ref 4.22–5.81)
RDW: 13.6 % (ref 11.5–14.6)

## 2012-02-01 LAB — LIPID PANEL
Cholesterol: 196 mg/dL (ref 0–200)
Total CHOL/HDL Ratio: 3
Triglycerides: 149 mg/dL (ref 0.0–149.0)

## 2012-02-01 LAB — HEPATIC FUNCTION PANEL
ALT: 21 U/L (ref 0–53)
Total Protein: 7.3 g/dL (ref 6.0–8.3)

## 2012-02-01 LAB — BASIC METABOLIC PANEL
CO2: 28 mEq/L (ref 19–32)
Chloride: 106 mEq/L (ref 96–112)
Creatinine, Ser: 0.8 mg/dL (ref 0.4–1.5)
Glucose, Bld: 94 mg/dL (ref 70–99)

## 2012-02-01 NOTE — Assessment & Plan Note (Signed)
Healthy Really at ideal body weight now Discussed PSA---will at least defer to next year Rx for zostavax
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
## 2012-02-01 NOTE — Assessment & Plan Note (Signed)
No problem on the med Will recheck labs

## 2012-03-27 ENCOUNTER — Other Ambulatory Visit: Payer: Self-pay | Admitting: *Deleted

## 2012-03-27 NOTE — Telephone Encounter (Signed)
Patient calling asking for samples of atorvastatin 40mg , I advised pt we didn't have samples.

## 2012-07-21 ENCOUNTER — Other Ambulatory Visit: Payer: Self-pay | Admitting: Internal Medicine

## 2012-08-08 ENCOUNTER — Telehealth: Payer: Self-pay | Admitting: Internal Medicine

## 2012-08-08 NOTE — Telephone Encounter (Signed)
Patient faxed in a Health Screening form to be faxed to [phone], and he needs it by Fri. 08/11/12.

## 2012-08-09 NOTE — Telephone Encounter (Signed)
Please take care of this.  

## 2012-08-09 NOTE — Telephone Encounter (Signed)
Spoke with patient and advised results   

## 2012-08-09 NOTE — Telephone Encounter (Signed)
Left message on pt's office number that I need his waist size to put on the form to be faxed.

## 2012-09-26 ENCOUNTER — Other Ambulatory Visit: Payer: Self-pay

## 2012-09-26 MED ORDER — ATORVASTATIN CALCIUM 40 MG PO TABS: 20.0000 mg | ORAL_TABLET | Freq: Every day | ORAL | Status: DC

## 2012-09-26 NOTE — Telephone Encounter (Signed)
Okay to fill for a year Let him know it is easier for [place] to just do it electronically

## 2012-09-26 NOTE — Telephone Encounter (Signed)
Spoke with patient and advised rx ready for pick-up and it will be at the front desk.  

## 2012-09-26 NOTE — Telephone Encounter (Signed)
Pt request written 90 day rx for Atorvastatin.call pt when ready for pick up; this is first time [facility].

## 2013-02-06 ENCOUNTER — Ambulatory Visit (INDEPENDENT_AMBULATORY_CARE_PROVIDER_SITE_OTHER): Payer: Managed Care, Other (non HMO) | Admitting: Internal Medicine

## 2013-02-06 ENCOUNTER — Encounter: Payer: Self-pay | Admitting: Internal Medicine

## 2013-02-06 VITALS — BP 120/70 | HR 55 | Temp 98.0°F | Ht 69.0 in | Wt 188.0 lb

## 2013-02-06 DIAGNOSIS — M109 Gout, unspecified: Secondary | ICD-10-CM

## 2013-02-06 DIAGNOSIS — L570 Actinic keratosis: Secondary | ICD-10-CM

## 2013-02-06 DIAGNOSIS — Z125 Encounter for screening for malignant neoplasm of prostate: Secondary | ICD-10-CM

## 2013-02-06 DIAGNOSIS — Z0000 Encounter for general adult medical examination without abnormal findings: Secondary | ICD-10-CM

## 2013-02-06 DIAGNOSIS — E785 Hyperlipidemia, unspecified: Secondary | ICD-10-CM

## 2013-02-06 DIAGNOSIS — Z23 Encounter for immunization: Secondary | ICD-10-CM

## 2013-02-06 LAB — HEPATIC FUNCTION PANEL
Albumin: 4.1 g/dL (ref 3.5–5.2)
Total Protein: 7 g/dL (ref 6.0–8.3)

## 2013-02-06 LAB — CBC WITH DIFFERENTIAL/PLATELET
Basophils Absolute: 0 10*3/uL (ref 0.0–0.1)
Eosinophils Absolute: 0.4 10*3/uL (ref 0.0–0.7)
HCT: 38.3 % — ABNORMAL LOW (ref 39.0–52.0)
Hemoglobin: 13 g/dL (ref 13.0–17.0)
Lymphs Abs: 1.4 10*3/uL (ref 0.7–4.0)
MCHC: 34.1 g/dL (ref 30.0–36.0)
MCV: 92.8 fl (ref 78.0–100.0)
Monocytes Absolute: 0.4 10*3/uL (ref 0.1–1.0)
Neutro Abs: 4 10*3/uL (ref 1.4–7.7)
Platelets: 227 10*3/uL (ref 150.0–400.0)
RDW: 13.5 % (ref 11.5–14.6)

## 2013-02-06 LAB — BASIC METABOLIC PANEL
BUN: 13 mg/dL (ref 6–23)
CO2: 29 mEq/L (ref 19–32)
Chloride: 104 mEq/L (ref 96–112)
GFR: 108.83 mL/min (ref >60.00)
Glucose, Bld: 101 mg/dL — ABNORMAL HIGH (ref 70–99)
Potassium: 4.1 mEq/L (ref 3.5–5.1)

## 2013-02-06 LAB — TSH: TSH: 1.71 u[IU]/mL (ref 0.35–5.50)

## 2013-02-06 LAB — LDL CHOLESTEROL, DIRECT: Direct LDL: 111.3 mg/dL

## 2013-02-06 LAB — LIPID PANEL
Cholesterol: 201 mg/dL — ABNORMAL HIGH (ref 0–200)
Total CHOL/HDL Ratio: 3
Triglycerides: 146 mg/dL (ref 0.0–149.0)

## 2013-02-06 MED ORDER — ATORVASTATIN CALCIUM 20 MG PO TABS: 20.0000 mg | ORAL_TABLET | Freq: Every day | ORAL | Status: DC

## 2013-02-06 MED ORDER — COLCHICINE 0.6 MG PO TABS: 0.6000 mg | ORAL_TABLET | Freq: Two times a day (BID) | ORAL | Status: DC | PRN

## 2013-02-06 NOTE — Assessment & Plan Note (Signed)
Continues with primary prevention Due for labs

## 2013-02-06 NOTE — Assessment & Plan Note (Signed)
Single forehead lesion treated with liquid nitrogen 40 seconds x 2 Discussed home care

## 2013-02-06 NOTE — Assessment & Plan Note (Signed)
Generally healthy Discussed fitness Due for PSA

## 2013-02-06 NOTE — Assessment & Plan Note (Signed)
Rarely needs the colchicine
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
## 2013-02-06 NOTE — Addendum Note (Signed)
Addended by: Sueanne Margarita on: 02/06/2013 09:56 AM   Modules accepted: Orders

## 2013-04-19 ENCOUNTER — Other Ambulatory Visit: Payer: Self-pay

## 2013-05-25 ENCOUNTER — Encounter: Payer: Self-pay | Admitting: Gastroenterology

## 2013-07-17 ENCOUNTER — Telehealth: Payer: Self-pay | Admitting: Internal Medicine

## 2013-07-17 NOTE — Telephone Encounter (Signed)
Pt called requesting a colonoscopy. Please advise.

## 2013-07-21 NOTE — Telephone Encounter (Signed)
He is due for his 5 year recall Did GI take care of this Please have them schedule his follow up as he is due

## 2013-07-30 NOTE — Telephone Encounter (Signed)
Patient will call himself to schedule.

## 2013-08-24 ENCOUNTER — Encounter: Payer: Self-pay | Admitting: Gastroenterology

## 2013-10-09 ENCOUNTER — Ambulatory Visit (AMBULATORY_SURGERY_CENTER): Payer: Self-pay | Admitting: *Deleted

## 2013-10-09 VITALS — Ht 69.0 in | Wt 185.0 lb

## 2013-10-09 DIAGNOSIS — Z86010 Personal history of colonic polyps: Secondary | ICD-10-CM

## 2013-10-09 NOTE — Progress Notes (Signed)
No egg or soy allergy  No home oxygen use or problems with anesthesia  No medications for weight loss taken  emmi information given  Prepopik sample given to pt

## 2013-10-23 ENCOUNTER — Ambulatory Visit (AMBULATORY_SURGERY_CENTER): Payer: Managed Care, Other (non HMO) | Admitting: Gastroenterology

## 2013-10-23 ENCOUNTER — Encounter: Payer: Self-pay | Admitting: Gastroenterology

## 2013-10-23 VITALS — BP 126/61 | HR 49 | Temp 97.8°F | Resp 21 | Ht 69.0 in | Wt 185.0 lb

## 2013-10-23 DIAGNOSIS — D126 Benign neoplasm of colon, unspecified: Secondary | ICD-10-CM

## 2013-10-23 DIAGNOSIS — Z86010 Personal history of colonic polyps: Secondary | ICD-10-CM

## 2013-10-23 MED ORDER — SODIUM CHLORIDE 0.9 % IV SOLN: 500.0000 mL | INTRAVENOUS | Status: DC

## 2013-10-23 NOTE — Progress Notes (Signed)
Called to room to assist during endoscopic procedure.  Patient ID and intended procedure confirmed with present staff. Received instructions for my participation in the procedure from the performing physician.  

## 2013-10-23 NOTE — Progress Notes (Signed)
A/ox3 pleased with MAC, report to Kristin RN 

## 2013-10-23 NOTE — Patient Instructions (Signed)

## 2013-10-23 NOTE — Op Note (Signed)
[place]  [address]. [place], [address]   COLONOSCOPY PROCEDURE REPORT  PATIENT: Bradd, Merlos  MR#: [id number] BIRTHDATE: 03/31/1951 , 55  yrs. old GENDER: Male ENDOSCOPIST: Ladene Artist, MD, [facility] PROCEDURE DATE:  10/23/2013 PROCEDURE:   Colonoscopy with snare polypectomy First Screening Colonoscopy - Avg.  risk and is 50 yrs.  old or older - No.  Prior Negative Screening - Now for repeat screening. N/A  History of Adenoma - Now for follow-up colonoscopy & has been > or = to 3 yrs.  Yes hx of adenoma.  Has been 3 or more years since last colonoscopy.  Polyps Removed Today? Yes. ASA CLASS:   Class II INDICATIONS:Patient's personal history of adenomatous colon polyps.  MEDICATIONS: MAC sedation, administered by CRNA and propofol (Diprivan) 200mg  IV DESCRIPTION OF PROCEDURE:   After the risks benefits and alternatives of the procedure were thoroughly explained, informed consent was obtained.  A digital rectal exam revealed no abnormalities of the rectum.   The [id number]  endoscope was introduced through the anus and advanced to the cecum, which was identified by both the appendix and ileocecal valve. No adverse events experienced.   The quality of the prep was Prepopik good The instrument was then slowly withdrawn as the colon was fully examined.  COLON FINDINGS: Three sessile polyps were found in the transverse colon, descending colon, and rectum.  Endoscopic mucosal resection was performed.  A polypectomy was performed with a cold snare.  The resection was complete and the polyp tissue was completely retrieved.   The colon was otherwise normal.  There was no diverticulosis, inflammation, polyps or cancers unless previously stated.  Retroflexed views revealed small internal hemorrhoids. The time to cecum=1 minutes 09 seconds.  Withdrawal time=10 minutes 47 seconds.  The scope was withdrawn and the procedure completed. COMPLICATIONS: There  were no complications.  ENDOSCOPIC IMPRESSION: 1.   Three sessile polyps in the transverse, descending, and rectum; polypectomy performed with a cold snare 2.   Small internal hemorrhoids  RECOMMENDATIONS: 1.  Await pathology results 2.  Repeat Colonoscopy in 5 years.  eSigned:  Ladene Artist, MD, [facility] 10/23/2013 8:29 AM

## 2013-10-24 ENCOUNTER — Telehealth: Payer: Self-pay | Admitting: *Deleted

## 2013-10-24 NOTE — Telephone Encounter (Signed)
Message left

## 2013-10-29 ENCOUNTER — Encounter: Payer: Self-pay | Admitting: Gastroenterology

## 2014-01-06 ENCOUNTER — Other Ambulatory Visit: Payer: Self-pay | Admitting: Internal Medicine

## 2014-02-08 ENCOUNTER — Encounter: Payer: Self-pay | Admitting: Internal Medicine

## 2014-02-08 ENCOUNTER — Ambulatory Visit (INDEPENDENT_AMBULATORY_CARE_PROVIDER_SITE_OTHER): Payer: Managed Care, Other (non HMO) | Admitting: Internal Medicine

## 2014-02-08 VITALS — BP 118/60 | HR 58 | Temp 98.1°F | Ht 68.0 in | Wt 182.0 lb

## 2014-02-08 DIAGNOSIS — M109 Gout, unspecified: Secondary | ICD-10-CM

## 2014-02-08 DIAGNOSIS — Z0000 Encounter for general adult medical examination without abnormal findings: Secondary | ICD-10-CM

## 2014-02-08 DIAGNOSIS — E785 Hyperlipidemia, unspecified: Secondary | ICD-10-CM

## 2014-02-08 LAB — CBC WITH DIFFERENTIAL/PLATELET
BASOS ABS: 0 10*3/uL (ref 0.0–0.1)
Basophils Relative: 0.7 % (ref 0.0–3.0)
Eosinophils Absolute: 0.3 10*3/uL (ref 0.0–0.7)
Eosinophils Relative: 5.4 % — ABNORMAL HIGH (ref 0.0–5.0)
HEMATOCRIT: 40.1 % (ref 39.0–52.0)
Hemoglobin: 13.6 g/dL (ref 13.0–17.0)
Lymphocytes Relative: 27.7 % (ref 12.0–46.0)
Lymphs Abs: 1.7 10*3/uL (ref 0.7–4.0)
MCHC: 33.8 g/dL (ref 30.0–36.0)
MCV: 94.2 fl (ref 78.0–100.0)
MONO ABS: 0.4 10*3/uL (ref 0.1–1.0)
Monocytes Relative: 6.5 % (ref 3.0–12.0)
Neutro Abs: 3.7 10*3/uL (ref 1.4–7.7)
Neutrophils Relative %: 59.7 % (ref 43.0–77.0)
PLATELETS: 235 10*3/uL (ref 150.0–400.0)
RBC: 4.26 Mil/uL (ref 4.22–5.81)
RDW: 13.8 % (ref 11.5–15.5)
WBC: 6.2 10*3/uL (ref 4.0–10.5)

## 2014-02-08 LAB — COMPREHENSIVE METABOLIC PANEL
ALK PHOS: 59 U/L (ref 39–117)
ALT: 38 U/L (ref 0–53)
AST: 38 U/L — AB (ref 0–37)
Albumin: 4.2 g/dL (ref 3.5–5.2)
BUN: 14 mg/dL (ref 6–23)
CO2: 25 mEq/L (ref 19–32)
Calcium: 9.3 mg/dL (ref 8.4–10.5)
Chloride: 105 mEq/L (ref 96–112)
Creatinine, Ser: 0.8 mg/dL (ref 0.4–1.5)
GFR: 102.32 mL/min (ref >60.00)
Glucose, Bld: 92 mg/dL (ref 70–99)
Potassium: 3.5 mEq/L (ref 3.5–5.1)
SODIUM: 139 meq/L (ref 135–145)
TOTAL PROTEIN: 7.4 g/dL (ref 6.0–8.3)
Total Bilirubin: 0.9 mg/dL (ref 0.2–1.2)

## 2014-02-08 LAB — LIPID PANEL
Cholesterol: 168 mg/dL (ref 0–200)
HDL: 53.8 mg/dL (ref >39.00)
LDL Cholesterol: 75 mg/dL (ref 0–99)
NONHDL: 114.2
Total CHOL/HDL Ratio: 3
Triglycerides: 195 mg/dL — ABNORMAL HIGH (ref 0.0–149.0)
VLDL: 39 mg/dL (ref 0.0–40.0)

## 2014-02-08 LAB — T4, FREE: FREE T4: 0.82 ng/dL (ref 0.60–1.60)

## 2014-02-08 MED ORDER — COLCHICINE 0.6 MG PO TABS: 0.6000 mg | ORAL_TABLET | Freq: Two times a day (BID) | ORAL | Status: DC | PRN

## 2014-02-08 MED ORDER — ZOSTER VACCINE LIVE 19400 UNT/0.65ML ~~LOC~~ SOLR: 0.6500 mL | Freq: Once | SUBCUTANEOUS | Status: DC

## 2014-02-08 NOTE — Assessment & Plan Note (Signed)
No problems with the statin Due for labs

## 2014-02-08 NOTE — Assessment & Plan Note (Signed)
Healthy Discussed increased exercise PSA deferred to next year Zostavax Recommended flu shot
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
## 2014-02-08 NOTE — Progress Notes (Signed)
Pre visit review using our clinic review tool, if applicable. No additional management support is needed unless otherwise documented below in the visit note. 

## 2014-02-08 NOTE — Assessment & Plan Note (Signed)
Uses the colchicine with success

## 2014-06-14 DIAGNOSIS — C4359 Malignant melanoma of other part of trunk: Secondary | ICD-10-CM

## 2014-06-14 HISTORY — DX: Malignant melanoma of other part of trunk: C43.59

## 2014-06-18 ENCOUNTER — Other Ambulatory Visit: Payer: Self-pay | Admitting: Dermatology

## 2014-06-20 ENCOUNTER — Encounter: Payer: Self-pay | Admitting: Internal Medicine

## 2014-06-27 ENCOUNTER — Other Ambulatory Visit: Payer: Self-pay | Admitting: Dermatology

## 2014-07-11 ENCOUNTER — Other Ambulatory Visit: Payer: Self-pay | Admitting: Dermatology

## 2014-08-19 ENCOUNTER — Telehealth: Payer: Self-pay

## 2014-08-19 NOTE — Telephone Encounter (Signed)
Pt brought letter from [facility] that colcrys will no longer be covered as of 08/24/14; preferred alternative is [email or web address].letter in [place] Silversmith NP in box.[facility].pt request cb.

## 2014-08-19 NOTE — Telephone Encounter (Signed)
This is Dr. Silvio Pate pt. It can wait until he returns. Placed form in Dr. Everardo Beals box.

## 2014-08-20 NOTE — Telephone Encounter (Signed)
mitigare is the exact same medication--colchicine Please send form approving change

## 2014-08-23 MED ORDER — COLCHICINE 0.6 MG PO CAPS: 1.0000 | ORAL_CAPSULE | Freq: Two times a day (BID) | ORAL | Status: DC | PRN

## 2014-08-23 NOTE — Telephone Encounter (Signed)
Spoke with pt and he wants med transferred to [place]; I advised him I will have [facility] change med to mitigare and pt can have [facility] call to have rx transferred. Pt did not want to go to that trouble and I asked Kelly at [facility] if she had mitigare in stock. Claiborne Billings said would order and get in on 08/26/14 after 3 pm. Cost to pt would be $90.00. Pt said how much would colchicine be today since ins does not change until 08/24/14. Claiborne Billings said same price of $90.00. Pt said to change med to mitigare and not order any at this time; pt still has med at home since uses prn. Pt will decide what he wants to do and contact pharmacy at later time. Claiborne Billings said would change to mitigare as instructed.

## 2014-08-23 NOTE — Addendum Note (Signed)
Addended by: Helene Shoe on: 08/23/2014 05:03 PM   Modules accepted: Orders, Medications

## 2014-09-11 ENCOUNTER — Other Ambulatory Visit: Payer: Self-pay | Admitting: Internal Medicine

## 2015-02-18 ENCOUNTER — Encounter: Payer: Managed Care, Other (non HMO) | Admitting: Internal Medicine

## 2015-03-04 ENCOUNTER — Ambulatory Visit (INDEPENDENT_AMBULATORY_CARE_PROVIDER_SITE_OTHER): Payer: Managed Care, Other (non HMO) | Admitting: Internal Medicine

## 2015-03-04 ENCOUNTER — Encounter: Payer: Self-pay | Admitting: Internal Medicine

## 2015-03-04 VITALS — BP 132/70 | HR 56 | Temp 98.5°F | Ht 68.0 in | Wt 185.0 lb

## 2015-03-04 DIAGNOSIS — Z125 Encounter for screening for malignant neoplasm of prostate: Secondary | ICD-10-CM | POA: Diagnosis not present

## 2015-03-04 DIAGNOSIS — Z0000 Encounter for general adult medical examination without abnormal findings: Secondary | ICD-10-CM

## 2015-03-04 DIAGNOSIS — M1000 Idiopathic gout, unspecified site: Secondary | ICD-10-CM | POA: Diagnosis not present

## 2015-03-04 DIAGNOSIS — R7989 Other specified abnormal findings of blood chemistry: Secondary | ICD-10-CM | POA: Diagnosis not present

## 2015-03-04 DIAGNOSIS — E785 Hyperlipidemia, unspecified: Secondary | ICD-10-CM

## 2015-03-04 LAB — COMPREHENSIVE METABOLIC PANEL
ALBUMIN: 4.4 g/dL (ref 3.5–5.2)
ALT: 20 U/L (ref 0–53)
AST: 21 U/L (ref 0–37)
Alkaline Phosphatase: 59 U/L (ref 39–117)
BILIRUBIN TOTAL: 0.6 mg/dL (ref 0.2–1.2)
BUN: 13 mg/dL (ref 6–23)
CALCIUM: 9.5 mg/dL (ref 8.4–10.5)
CHLORIDE: 102 meq/L (ref 96–112)
CO2: 30 mEq/L (ref 19–32)
CREATININE: 0.77 mg/dL (ref 0.40–1.50)
GFR: 108.11 mL/min (ref >60.00)
Glucose, Bld: 103 mg/dL — ABNORMAL HIGH (ref 70–99)
Potassium: 4.4 mEq/L (ref 3.5–5.1)
Sodium: 139 mEq/L (ref 135–145)
Total Protein: 7.2 g/dL (ref 6.0–8.3)

## 2015-03-04 LAB — LIPID PANEL
CHOL/HDL RATIO: 4
Cholesterol: 222 mg/dL — ABNORMAL HIGH (ref 0–200)
HDL: 62.6 mg/dL (ref >39.00)
NONHDL: 159.57
TRIGLYCERIDES: 225 mg/dL — AB (ref 0.0–149.0)
VLDL: 45 mg/dL — ABNORMAL HIGH (ref 0.0–40.0)

## 2015-03-04 LAB — CBC WITH DIFFERENTIAL/PLATELET
BASOS ABS: 0 10*3/uL (ref 0.0–0.1)
Basophils Relative: 0.4 % (ref 0.0–3.0)
EOS ABS: 0.4 10*3/uL (ref 0.0–0.7)
Eosinophils Relative: 6.3 % — ABNORMAL HIGH (ref 0.0–5.0)
HEMATOCRIT: 42.2 % (ref 39.0–52.0)
HEMOGLOBIN: 14 g/dL (ref 13.0–17.0)
LYMPHS PCT: 25.5 % (ref 12.0–46.0)
Lymphs Abs: 1.8 10*3/uL (ref 0.7–4.0)
MCHC: 33.2 g/dL (ref 30.0–36.0)
MCV: 95.3 fl (ref 78.0–100.0)
Monocytes Absolute: 0.4 10*3/uL (ref 0.1–1.0)
Monocytes Relative: 6.2 % (ref 3.0–12.0)
Neutro Abs: 4.3 10*3/uL (ref 1.4–7.7)
Neutrophils Relative %: 61.6 % (ref 43.0–77.0)
Platelets: 219 10*3/uL (ref 150.0–400.0)
RBC: 4.42 Mil/uL (ref 4.22–5.81)
RDW: 14.1 % (ref 11.5–15.5)
WBC: 7 10*3/uL (ref 4.0–10.5)

## 2015-03-04 LAB — PSA: PSA: 0.33 ng/mL (ref 0.10–4.00)

## 2015-03-04 LAB — URIC ACID: Uric Acid, Serum: 7.5 mg/dL (ref 4.0–7.8)

## 2015-03-04 LAB — LDL CHOLESTEROL, DIRECT: Direct LDL: 122 mg/dL

## 2015-03-04 MED ORDER — ZOSTER VACCINE LIVE 19400 UNT/0.65ML ~~LOC~~ SOLR: 0.6500 mL | Freq: Once | SUBCUTANEOUS | Status: DC

## 2015-03-04 NOTE — Progress Notes (Signed)
Subjective:    Patient ID: Johnathan Lambert, male    DOB: 1950/12/06, 64 y.o.   MRN: [id number]  HPI Here for physical  Had pain in right 5th MCP Was clicking but is better now Injured left shoulder while pressure washing his house Felt it pop once when reaching and that is better Occasional pain in right groin--no bulge. Not a big deal No meds for any of this--except rare ibuprofen  Still working full time Not sure about retirement  Current Outpatient Prescriptions on File Prior to Visit  Medication Sig Dispense Refill  . aspirin 81 MG tablet Take 81 mg by mouth daily.      Marland Kitchen atorvastatin (LIPITOR) 20 MG tablet TAKE 1 TABLET DAILY 90 tablet 3  . Colchicine 0.6 MG CAPS Take 1 capsule by mouth 2 (two) times daily as needed. 60 capsule 5  . Multiple Vitamin (MULTIVITAMIN) tablet Take 1 tablet by mouth daily.       No current facility-administered medications on file prior to visit.    No Known Allergies  Past Medical History  Diagnosis Date  . FH: colonic polyps   . Hyperlipidemia   . Gout   . Broken arm 1990    cast  . Melanoma of back 1/16    Past Surgical History  Procedure Laterality Date  . Shoulder surgery  1985    Right  . Cataract extraction w/ intraocular lens  implant, bilateral  3/12    both eyes  . Colonoscopy      Family History  Problem Relation Age of Onset  . Hyperlipidemia Mother   . Hyperthyroidism Mother   . Heart disease Father 25    MI and CAD  . Hyperthyroidism Sister   . Hyperthyroidism Brother   . Cancer Paternal Uncle     lung  . Colon cancer Neg Hx   . Esophageal cancer Neg Hx   . Rectal cancer Neg Hx   . Stomach cancer Neg Hx     Social History   Social History  . Marital Status: Married    Spouse Name: N/A  . Number of Children: 2  . Years of Education: N/A   Occupational History  . Line supervisor     [employer]   Social History Main Topics  . Smoking status: Never Smoker   . Smokeless tobacco: Never Used  . Alcohol  Use: 0.0 oz/week     Comment: Occassionally  . Drug Use: No  . Sexual Activity: Not on file   Other Topics Concern  . Not on file   Social History Narrative   Walks and enjoys golf.   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       Wears seat belt  HENT: Negative for dental problem, hearing loss and tinnitus.        Keeps up with dentist  Eyes: Negative for visual disturbance.       No diplopia or unilateral vision loss  Respiratory: Negative for cough, chest tightness and shortness of breath.   Cardiovascular: Negative for chest pain, palpitations and leg swelling.  Gastrointestinal: Negative for nausea, vomiting, abdominal pain, constipation and blood in stool.       No heartburn   Endocrine: Negative for polydipsia and polyuria.  Genitourinary: Negative for urgency and difficulty urinating.       No sexual problems  Musculoskeletal: Positive for arthralgias. Negative for  myalgias, back pain and joint swelling.  Skin: Negative for rash.       Keeps up with dermatologist  Allergic/Immunologic: Positive for environmental allergies. Negative for immunocompromised state.       Mild stuffiness  Neurological: Negative for dizziness, syncope, weakness, light-headedness, numbness and headaches.  Hematological: Negative for adenopathy. Does not bruise/bleed easily.  Psychiatric/Behavioral: Negative for sleep disturbance and dysphoric mood. The patient is not nervous/anxious.        5 hours of sleep is enough       Objective:   Physical Exam  Constitutional: He is oriented to person, place, and time. He appears well-developed and well-nourished. No distress.  HENT:  Head: Normocephalic and atraumatic.  Right Ear: External ear normal.  Left Ear: External ear normal.  Mouth/Throat: Oropharynx is clear and moist. No oropharyngeal exudate.  Eyes: Conjunctivae and EOM are  normal. Pupils are equal, round, and reactive to light.  Neck: Normal range of motion. Neck supple. No thyromegaly present.  Cardiovascular: Normal rate, normal heart sounds and intact distal pulses.  Exam reveals no gallop.   No murmur heard. Pulmonary/Chest: Effort normal and breath sounds normal. No respiratory distress. He has no wheezes. He has no rales.  Abdominal: Soft. There is no tenderness.  Musculoskeletal: He exhibits no edema or tenderness.  Lymphadenopathy:    He has no cervical adenopathy.  Neurological: He is alert and oriented to person, place, and time.  Skin: No rash noted. No erythema.  Psychiatric: He has a normal mood and affect. His behavior is normal.          Assessment & Plan:

## 2015-03-04 NOTE — Assessment & Plan Note (Signed)
Fortunately rare Colchicine not covered Discussed trying high dose ibuprofen

## 2015-03-04 NOTE — Assessment & Plan Note (Signed)
Healthy Rx for zostavax again Flu vaccine at work PSA today after discussion Colon due 2020

## 2015-03-04 NOTE — Progress Notes (Signed)
Pre visit review using our clinic review tool, if applicable. No additional management support is needed unless otherwise documented below in the visit note. 

## 2015-03-04 NOTE — Assessment & Plan Note (Signed)
Will continue primary prevention Due for labs

## 2015-04-02 ENCOUNTER — Encounter: Payer: Self-pay | Admitting: Gastroenterology

## 2015-09-05 ENCOUNTER — Other Ambulatory Visit: Payer: Self-pay | Admitting: Internal Medicine

## 2015-10-08 ENCOUNTER — Telehealth: Payer: Self-pay

## 2015-10-08 NOTE — Telephone Encounter (Signed)
Pt left note wanting to update pharmacy list to new pharmacy;[facility], [place]. Done as requested.

## 2015-10-15 ENCOUNTER — Ambulatory Visit (INDEPENDENT_AMBULATORY_CARE_PROVIDER_SITE_OTHER)
Admission: RE | Admit: 2015-10-15 | Discharge: 2015-10-15 | Disposition: A | Discharge status: Home / Self Care | Payer: Managed Care, Other (non HMO) | Source: Ambulatory Visit | Attending: Family Medicine | Admitting: Family Medicine

## 2015-10-15 ENCOUNTER — Ambulatory Visit (INDEPENDENT_AMBULATORY_CARE_PROVIDER_SITE_OTHER): Payer: Managed Care, Other (non HMO) | Admitting: Family Medicine

## 2015-10-15 ENCOUNTER — Encounter: Payer: Self-pay | Admitting: Family Medicine

## 2015-10-15 VITALS — BP 126/68 | HR 54 | Temp 98.5°F | Ht 68.0 in | Wt 187.5 lb

## 2015-10-15 DIAGNOSIS — M25551 Pain in right hip: Secondary | ICD-10-CM

## 2015-10-15 DIAGNOSIS — M19011 Primary osteoarthritis, right shoulder: Secondary | ICD-10-CM | POA: Diagnosis not present

## 2015-10-15 DIAGNOSIS — S76011A Strain of muscle, fascia and tendon of right hip, initial encounter: Secondary | ICD-10-CM | POA: Diagnosis not present

## 2015-10-15 DIAGNOSIS — M25511 Pain in right shoulder: Secondary | ICD-10-CM

## 2015-10-15 DIAGNOSIS — M129 Arthropathy, unspecified: Secondary | ICD-10-CM

## 2015-10-15 NOTE — Progress Notes (Signed)
Dr. Frederico Hamman T. [place] Yeats, MD, [facility] [place], [address] Phone: [phone] Fax: [phone]  10/15/2015  Patient: Johnathan Lambert, MRN: [id number], DOB: 1950/09/04, 65 y.o.  Primary Physician:  Viviana Simpler, MD   Chief Complaint  Patient presents with  . Shoulder Pain    Right  . Hip Pain    Right   Subjective:   Johnathan Lambert is a 65 y.o. very pleasant male patient who presents with the following:  The patient is seen with 2 primary complaints.  Right shoulder pain ongoing for many years, but worsened more in the recent months along with right-sided hip pain worsened with walking and activity and pain in the groin.  Right shoulder history is significant for an injury between 30 and 40 years ago with a skiing accident where the patient reports that he had a traumatic dislocation while skiing, and he was able to self reduce this.  He had multiple injuries to his shoulder including what sounds like a partial thickness rotator cuff tear, but he is not entirely sure of the exact pathology.  Nevertheless, he had open surgery, open rotator cuff repair and potentially other operative treatment of this shoulder injury more than 30 years ago.  Right now he has not particularly much pain at baseline, but he does have pain with in points of terminal motion.  He is able to play golf and he is still fairly active.  He has not had any recent trauma.  Right hip, ongoing for the last month or so, the patient is had pain in the groin and pain with movement in particular, and noted with walking.  No traumatic injury.  No serious prior injury.  At baseline this is not particularly bothersome are bad for the patient.  Past Medical History, Surgical History, Social History, Family History, Problem List, Medications, and Allergies have been reviewed and updated if relevant.  Patient Active Problem List   Diagnosis Date Noted  . Routine  general medical examination at a health care facility 01/28/2011  . Gout 11/28/2009  . ACTINIC KERATOSIS 06/05/2008  . Hyperlipemia 01/04/2007  . COLONIC POLYPS, HX OF 01/04/2007    Past Medical History  Diagnosis Date  . FH: colonic polyps   . Hyperlipidemia   . Gout   . Broken arm 1990    cast  . Melanoma of back ([place]) 1/16    Past Surgical History  Procedure Laterality Date  . Shoulder surgery  1985    Right  . Cataract extraction w/ intraocular lens  implant, bilateral  3/12    both eyes  . Colonoscopy      Social History   Social History  . Marital Status: Married    Spouse Name: N/A  . Number of Children: 2  . Years of Education: N/A   Occupational History  . Line supervisor     [employer]   Social History Main Topics  . Smoking status: Never Smoker   . Smokeless tobacco: Never Used  . Alcohol Use: 0.0 oz/week     Comment: Occassionally  . Drug Use: No  . Sexual Activity: Not on file   Other Topics Concern  . Not on file   Social History Narrative   Walks and enjoys golf.   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 Hyperlipidemia Mother   . Hyperthyroidism  Mother   . Heart disease Father 18    MI and CAD  . Hyperthyroidism Sister   . Hyperthyroidism Brother   . Cancer Paternal Uncle     lung  . Colon cancer Neg Hx   . Esophageal cancer Neg Hx   . Rectal cancer Neg Hx   . Stomach cancer Neg Hx     No Known Allergies  Medication list reviewed and updated in full in [place].  GEN: No fevers, chills. Nontoxic. Primarily MSK c/o today. MSK: Detailed in the HPI GI: tolerating PO intake without difficulty Neuro: No numbness, parasthesias, or tingling associated. Otherwise the pertinent positives of the ROS are noted above.   Objective:   BP 126/68 mmHg  Pulse 54  Temp(Src) 98.5 F (36.9 C) (Oral)  Ht 5\' 8"  (1.727 m)  Wt 187 lb 8 oz (85.049 kg)  BMI 28.52  kg/m2   GEN: WDWN, NAD, Non-toxic, Alert & Oriented x 3 HEENT: Atraumatic, Normocephalic.  Ears and Nose: No external deformity. EXTR: No clubbing/cyanosis/edema NEURO: Normal gait.  PSYCH: Normally interactive. Conversant. Not depressed or anxious appearing.  Calm demeanor.   Shoulder: R and L Inspection: No muscle wasting or winging Ecchymosis/edema: neg  AC joint, scapula, clavicle: NT Cervical spine: NT, full ROM Spurling's: neg ABNORMAL SIDE TESTED: R UNLESS OTHERWISE NOTED, THE CONTRALATERAL SIDE HAS FULL RANGE OF MOTION. Abduction: 5/5, LIMITED TO 165 DEGREES Flexion: 5/5, LIMITED TO 165 DEGNO ROM  IR, lift-off: 5/5. TESTED AT 90 DEGREES OF ABDUCTION, LIMITED TO 10 DEGREES ER at neutral:  5/5, TESTED AT 90 DEGREES OF ABDUCTION, LIMITED TO 25 DEGREES AC crossover and compression: PAIN Drop Test: neg Empty Can: neg Supraspinatus insertion: NT Bicipital groove: NT Neer and Hawkins are negative C5-T1 intact Sensation intact Grip 5/5    HIP EXAM: SIDE: R ROM: Abduction, Flexion, Internal and External range of motion: full range of motion in abduction, minor loss of motion and terminal internal range of motion, but grossly normal rotational movement in the hip. Pain with terminal IROM and EROM: mild pain with terminal internal range of motion GTB: NT SLR: NEG Knees: No effusion FABER: some anterior pain REVERSE FABER: NT, neg Piriformis: NT at direct palpation Str: flexion: 5/5 abduction: 5/5 adduction: 4+/5 Resisted sartorius testing causes pain and is notably weaker compared to the surrounding musculature.  Radiology: Dg Shoulder Right  10/15/2015  CLINICAL DATA:  Right shoulder pain EXAM: RIGHT SHOULDER - 2+ VIEW COMPARISON:  None. FINDINGS: Considerable degenerative change of the glenohumeral and acromioclavicular joints are seen. No acute fracture or dislocation is noted. No gross soft tissue abnormality is seen. IMPRESSION: Chronic degenerative change without  acute abnormality. Electronically Signed   By: Inez Catalina M.D.   On: 10/15/2015 15:20   Dg Hip Unilat With Pelvis 2-3 Views Right  10/15/2015  CLINICAL DATA:  Right groin pain, initial encounter EXAM: DG HIP (WITH OR WITHOUT PELVIS) 2-3V RIGHT COMPARISON:  None. FINDINGS: Pelvic ring is intact. Degenerative changes of the hip joints noted bilaterally. No acute fracture or dislocation is noted. No gross soft tissue abnormality is seen. IMPRESSION: Degenerative change without acute abnormality. Electronically Signed   By: Inez Catalina M.D.   On: 10/15/2015 15:23    The radiological images were independently reviewed by myself in the office and results were reviewed with the patient. My independent interpretation of images:  Three-view right sided shoulder series with the primary finding of advanced glenohumeral arthritis, most notable in the Tancred view.  Also prominent axillary Y-view.  There is bone-on-bone pathology, most notably inferiorly, with additional fairly large new bone and osteophyte formation inferiorly.  There is also advanced acromioclavicular arthritis.  No fractures.  No dislocation. Electronically Signed  By: Owens Loffler, MD On: 10/16/2015 9:22 AM   The radiological images were independently reviewed by myself in the office and results were reviewed with the patient. My independent interpretation of images:  Right-sided hip films show mild bilateral osteoarthritic changes of the hip.  No fractures are apparent.Electronically Signed  By: Owens Loffler, MD On: 10/16/2015 9:23 AM   Assessment and Plan:   Glenohumeral arthritis, right  Right shoulder pain - Plan: DG Shoulder Right  Right hip pain - Plan: DG HIP UNILAT WITH PELVIS 2-3 VIEWS RIGHT  Strain of hip adductor muscle, right, initial encounter  Arthritis of right acromioclavicular joint  Right-sided shoulder pain and lack of motion are secondary to end-stage glenohumeral arthritis as well as advanced acromioclavicular  arthritis.  Previous trauma almost certainly plays a role here.  Not particularly painful at baseline, for now Tylenol or NSAIDs as needed or prior to playing golf.  Recommended some basic range of motion which could possibly increase his range of motion somewhat.  At this point he is functional, and not really interested in total shoulder arthroplasty.  Hip pain, more properly sartorius tendinopathy.  Reviewed classic hip rehabilitation for this injury.  Follow-up: prn. If he has a significant shoulder flareup, an intra-articular shoulder injection would always be reasonable.  Orders Placed This Encounter  Procedures  . DG Shoulder Right  . DG HIP UNILAT WITH PELVIS 2-3 VIEWS RIGHT    Signed,  Juanangel Soderholm T. Jaquelyn Sakamoto, MD   Patient's Medications  New Prescriptions   No medications on file  Previous Medications   ASPIRIN 81 MG TABLET    Take 81 mg by mouth daily.     ATORVASTATIN (LIPITOR) 20 MG TABLET    TAKE 1 TABLET DAILY   COLCHICINE 0.6 MG CAPS    Take 1 capsule by mouth 2 (two) times daily as needed.   MULTIPLE VITAMIN (MULTIVITAMIN) TABLET    Take 1 tablet by mouth daily.     ZOSTER VACCINE LIVE, PF, (ZOSTAVAX) 13086 UNT/0.65ML INJECTION    Inject 19,400 Units into the skin once.  Modified Medications   No medications on file  Discontinued Medications   No medications on file

## 2015-10-15 NOTE — Progress Notes (Signed)
Pre visit review using our clinic review tool, if applicable. No additional management support is needed unless otherwise documented below in the visit note. 

## 2015-11-27 ENCOUNTER — Other Ambulatory Visit: Payer: Self-pay

## 2015-11-27 MED ORDER — ATORVASTATIN CALCIUM 20 MG PO TABS: 20.0000 mg | ORAL_TABLET | Freq: Every day | ORAL | Status: DC

## 2015-11-27 NOTE — Telephone Encounter (Signed)
Pt needs to change pharmacy to [facility]. Pt no longer uses [facility]. Request atorvastatin to [facility] scheduled 03/10/16 refill done per protocol and pt will ck with pharmacy for pick up.

## 2016-03-10 ENCOUNTER — Ambulatory Visit (INDEPENDENT_AMBULATORY_CARE_PROVIDER_SITE_OTHER): Payer: Managed Care, Other (non HMO) | Admitting: Internal Medicine

## 2016-03-10 ENCOUNTER — Encounter: Payer: Self-pay | Admitting: Internal Medicine

## 2016-03-10 VITALS — BP 138/86 | HR 62 | Temp 97.5°F | Ht 68.25 in | Wt 188.0 lb

## 2016-03-10 DIAGNOSIS — Z0000 Encounter for general adult medical examination without abnormal findings: Secondary | ICD-10-CM

## 2016-03-10 DIAGNOSIS — Z23 Encounter for immunization: Secondary | ICD-10-CM | POA: Diagnosis not present

## 2016-03-10 DIAGNOSIS — E785 Hyperlipidemia, unspecified: Secondary | ICD-10-CM | POA: Diagnosis not present

## 2016-03-10 DIAGNOSIS — M1000 Idiopathic gout, unspecified site: Secondary | ICD-10-CM

## 2016-03-10 LAB — CBC WITH DIFFERENTIAL/PLATELET
BASOS ABS: 0 10*3/uL (ref 0.0–0.1)
Basophils Relative: 0.6 % (ref 0.0–3.0)
EOS PCT: 4 % (ref 0.0–5.0)
Eosinophils Absolute: 0.3 10*3/uL (ref 0.0–0.7)
HEMATOCRIT: 39 % (ref 39.0–52.0)
HEMOGLOBIN: 13.5 g/dL (ref 13.0–17.0)
LYMPHS PCT: 19.2 % (ref 12.0–46.0)
Lymphs Abs: 1.6 10*3/uL (ref 0.7–4.0)
MCHC: 34.7 g/dL (ref 30.0–36.0)
MCV: 92 fl (ref 78.0–100.0)
MONOS PCT: 5.5 % (ref 3.0–12.0)
Monocytes Absolute: 0.4 10*3/uL (ref 0.1–1.0)
Neutro Abs: 5.8 10*3/uL (ref 1.4–7.7)
Neutrophils Relative %: 70.7 % (ref 43.0–77.0)
Platelets: 256 10*3/uL (ref 150.0–400.0)
RBC: 4.24 Mil/uL (ref 4.22–5.81)
RDW: 13.4 % (ref 11.5–15.5)
WBC: 8.2 10*3/uL (ref 4.0–10.5)

## 2016-03-10 LAB — COMPREHENSIVE METABOLIC PANEL
ALK PHOS: 67 U/L (ref 39–117)
ALT: 18 U/L (ref 0–53)
AST: 19 U/L (ref 0–37)
Albumin: 4.1 g/dL (ref 3.5–5.2)
BILIRUBIN TOTAL: 0.8 mg/dL (ref 0.2–1.2)
BUN: 13 mg/dL (ref 6–23)
CALCIUM: 9.3 mg/dL (ref 8.4–10.5)
CO2: 30 mEq/L (ref 19–32)
Chloride: 102 mEq/L (ref 96–112)
Creatinine, Ser: 0.79 mg/dL (ref 0.40–1.50)
GFR: 104.62 mL/min (ref >60.00)
Glucose, Bld: 96 mg/dL (ref 70–99)
POTASSIUM: 4 meq/L (ref 3.5–5.1)
Sodium: 140 mEq/L (ref 135–145)
TOTAL PROTEIN: 7.5 g/dL (ref 6.0–8.3)

## 2016-03-10 LAB — LIPID PANEL
CHOL/HDL RATIO: 3
Cholesterol: 193 mg/dL (ref 0–200)
HDL: 60.8 mg/dL (ref >39.00)
NONHDL: 132.57
Triglycerides: 219 mg/dL — ABNORMAL HIGH (ref 0.0–149.0)
VLDL: 43.8 mg/dL — AB (ref 0.0–40.0)

## 2016-03-10 LAB — LDL CHOLESTEROL, DIRECT: Direct LDL: 109 mg/dL

## 2016-03-10 MED ORDER — SILDENAFIL CITRATE 20 MG PO TABS: 60.0000 mg | ORAL_TABLET | Freq: Every day | ORAL | 11 refills | Status: DC | PRN

## 2016-03-10 NOTE — Assessment & Plan Note (Signed)
Rare symptoms-- colchicine works

## 2016-03-10 NOTE — Assessment & Plan Note (Signed)
Healthy Discussed fitness prevnar today--will get flu vaccine at work Colon due 2020 Defer PSA till at least next year

## 2016-03-10 NOTE — Progress Notes (Signed)
Subjective:    Patient ID: Johnathan Lambert, male    DOB: 1951/05/14, 65 y.o.   MRN: [id number]  HPI Here for physical  Recent ortho visit Right hip pain now seems better with chiropractic Holding off on injection Stretching and able to walk more now  Sold home--moved to [place] parents live there and need help Commuting here still to work Trying to walk more and be more active now  Occasional early gout symptoms Aborts easily with colchicine  Still on statin No problems with that  Current Outpatient Prescriptions on File Prior to Visit  Medication Sig Dispense Refill  . aspirin 81 MG tablet Take 81 mg by mouth daily.      Marland Kitchen atorvastatin (LIPITOR) 20 MG tablet Take 1 tablet (20 mg total) by mouth daily. 90 tablet 1  . Colchicine 0.6 MG CAPS Take 1 capsule by mouth 2 (two) times daily as needed. 60 capsule 5  . Multiple Vitamin (MULTIVITAMIN) tablet Take 1 tablet by mouth daily.       No current facility-administered medications on file prior to visit.     No Known Allergies  Past Medical History:  Diagnosis Date  . Broken arm 1990   cast  . FH: colonic polyps   . Gout   . Hyperlipidemia   . Melanoma of back ([place]) 1/16    Past Surgical History:  Procedure Laterality Date  . CATARACT EXTRACTION W/ INTRAOCULAR LENS  IMPLANT, BILATERAL  3/12   both eyes  . COLONOSCOPY    . SHOULDER SURGERY  1985   Right    Family History  Problem Relation Age of Onset  . Hyperlipidemia Mother   . Hyperthyroidism Mother   . Heart disease Father 70    MI and CAD  . Hyperthyroidism Sister   . Hyperthyroidism Brother   . Cancer Paternal Uncle     lung  . Colon cancer Neg Hx   . Esophageal cancer Neg Hx   . Rectal cancer Neg Hx   . Stomach cancer Neg Hx     Social History   Social History  . Marital status: Married    Spouse name: N/A  . Number of children: 2  . Years of education: N/A   Occupational History  . Line supervisor     [employer]   Social History  Main Topics  . Smoking status: Never Smoker  . Smokeless tobacco: Never Used  . Alcohol use 0.0 oz/week     Comment: Occassionally  . Drug use: No  . Sexual activity: Not on file   Other Topics Concern  . Not on file   Social History Narrative   Walks and enjoys golf.   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       Wears seat belt  HENT: Negative for dental problem and tinnitus.        ?mild hearing loss Keeps up with dentist  Eyes: Negative for visual disturbance.       No diplopia or unilateral vision loss  Respiratory: Negative for cough, chest tightness and shortness of breath.   Cardiovascular: Negative for chest pain, palpitations and leg swelling.  Gastrointestinal: Negative for abdominal pain, blood in stool, constipation, nausea and vomiting.       No heartburn  Endocrine: Negative for polydipsia and polyuria.  Genitourinary: Negative for difficulty urinating and urgency.  Mild ED--interested in trying meds  Musculoskeletal: Positive for arthralgias. Negative for back pain and joint swelling.  Skin: Negative for rash.       No suspicious lesions--does see derm regularly  Allergic/Immunologic: Negative for environmental allergies and immunocompromised state.  Neurological: Negative for dizziness, syncope, light-headedness and headaches.  Hematological: Negative for adenopathy. Bruises/bleeds easily.  Psychiatric/Behavioral: Negative for dysphoric mood. The patient is not nervous/anxious.        Not sleeping great---will have night awakening. OTC helps some (occasional)       Objective:   Physical Exam  Constitutional: He is oriented to person, place, and time. He appears well-developed and well-nourished. No distress.  HENT:  Head: Normocephalic and atraumatic.  Right Ear: External ear normal.  Left Ear: External ear normal.  Mouth/Throat:  Oropharynx is clear and moist. No oropharyngeal exudate.  Eyes: Conjunctivae are normal. Pupils are equal, round, and reactive to light.  Neck: Normal range of motion. Neck supple. No thyromegaly present.  Cardiovascular: Regular rhythm, normal heart sounds and intact distal pulses.  Exam reveals no gallop.   No murmur heard. Slow with regular extra beats  Pulmonary/Chest: Effort normal and breath sounds normal. No respiratory distress. He has no wheezes. He has no rales.  Abdominal: Soft. There is no tenderness.  Musculoskeletal: He exhibits no edema or tenderness.  Little internal rotation of right hip  Lymphadenopathy:    He has no cervical adenopathy.  Neurological: He is alert and oriented to person, place, and time.  Skin: No rash noted. No erythema.  Psychiatric: He has a normal mood and affect. His behavior is normal.          Assessment & Plan:

## 2016-03-10 NOTE — Addendum Note (Signed)
Addended by: Pilar Grammes on: 03/10/2016 09:50 AM   Modules accepted: Orders

## 2016-03-10 NOTE — Patient Instructions (Signed)
Don't take the flu shot till October 27th or after.

## 2016-03-10 NOTE — Progress Notes (Signed)
Pre visit review using our clinic review tool, if applicable. No additional management support is needed unless otherwise documented below in the visit note. 

## 2016-03-10 NOTE — Assessment & Plan Note (Signed)
No problems with statin for primary prevention 

## 2016-06-04 ENCOUNTER — Other Ambulatory Visit: Payer: Self-pay

## 2016-06-04 MED ORDER — COLCHICINE 0.6 MG PO CAPS: 1.0000 | ORAL_CAPSULE | Freq: Two times a day (BID) | ORAL | 11 refills | Status: DC | PRN

## 2016-06-04 MED ORDER — ATORVASTATIN CALCIUM 20 MG PO TABS: 20.0000 mg | ORAL_TABLET | Freq: Every day | ORAL | 2 refills | Status: DC

## 2016-06-04 NOTE — Telephone Encounter (Signed)
Pt request atorvastatin and colchicine to [facility]/ last annual 03/10/16. Refilled atorvastatin done per protocol and colchicine sent to Dr Silvio Pate for approval. Pt will ck with pharmacy later today.

## 2016-06-04 NOTE — Telephone Encounter (Signed)
Approved: #[id number] x 11 Shouldn't need approval since not controlled

## 2016-06-24 ENCOUNTER — Ambulatory Visit (INDEPENDENT_AMBULATORY_CARE_PROVIDER_SITE_OTHER): Payer: Managed Care, Other (non HMO) | Admitting: Internal Medicine

## 2016-06-24 ENCOUNTER — Encounter: Payer: Self-pay | Admitting: Internal Medicine

## 2016-06-24 VITALS — BP 120/82 | HR 78 | Temp 98.2°F | Resp 18 | Wt 191.0 lb

## 2016-06-24 DIAGNOSIS — J209 Acute bronchitis, unspecified: Secondary | ICD-10-CM

## 2016-06-24 MED ORDER — DOXYCYCLINE HYCLATE 100 MG PO TABS: 100.0000 mg | ORAL_TABLET | Freq: Two times a day (BID) | ORAL | 1 refills | Status: DC

## 2016-06-24 NOTE — Progress Notes (Signed)
Pre visit review using our clinic review tool, if applicable. No additional management support is needed unless otherwise documented below in the visit note. 

## 2016-06-24 NOTE — Patient Instructions (Signed)
Please start the antibiotic if you are worsening in the next few days. 

## 2016-06-24 NOTE — Assessment & Plan Note (Signed)
Sounds like time course for atypical bronchitis Counseled about possible 6 week time course No need for prednisone Will give Rx for doxy in case he worsens Supportive care

## 2016-06-24 NOTE — Progress Notes (Signed)
Subjective:    Patient ID: Johnathan Lambert, male    DOB: 12-Oct-1950, 66 y.o.   MRN: [id number]  HPI Here due to respiratory illness  Started with a cold around Christmas Spent a week at the beach--taking OTC meds. Seemed to feel better Now in past 2-3 days--tight in chest and head Hard to get a big breath in  Some post nasal drip--affecting his swallowing Throat slightly raw--mostly better Did have a lot of "crud" at first---not as much now No headache or ear pain No fever but slight sweat last night. No chills No sig SOB  Using tussin expectorant Dayquil/nyquil  Current Outpatient Prescriptions on File Prior to Visit  Medication Sig Dispense Refill  . aspirin 81 MG tablet Take 81 mg by mouth daily.      Marland Kitchen atorvastatin (LIPITOR) 20 MG tablet Take 1 tablet (20 mg total) by mouth daily. 90 tablet 2  . Colchicine 0.6 MG CAPS Take 1 capsule by mouth 2 (two) times daily as needed. 60 capsule 11  . Misc Natural Products (TURMERIC CURCUMIN) CAPS Take 1 capsule by mouth.    . Multiple Vitamin (MULTIVITAMIN) tablet Take 1 tablet by mouth daily.      . sildenafil (REVATIO) 20 MG tablet Take 3-5 tablets (60-100 mg total) by mouth daily as needed. 50 tablet 11   No current facility-administered medications on file prior to visit.     No Known Allergies  Past Medical History:  Diagnosis Date  . Broken arm 1990   cast  . FH: colonic polyps   . Gout   . Hyperlipidemia   . Melanoma of back ([place]) 1/16    Past Surgical History:  Procedure Laterality Date  . CATARACT EXTRACTION W/ INTRAOCULAR LENS  IMPLANT, BILATERAL  3/12   both eyes  . COLONOSCOPY    . SHOULDER SURGERY  1985   Right    Family History  Problem Relation Age of Onset  . Hyperlipidemia Mother   . Hyperthyroidism Mother   . Heart disease Father 25    MI and CAD  . Hyperthyroidism Sister   . Hyperthyroidism Brother   . Cancer Paternal Uncle     lung  . Colon cancer Neg Hx   . Esophageal cancer Neg Hx   .  Rectal cancer Neg Hx   . Stomach cancer Neg Hx     Social History   Social History  . Marital status: Married    Spouse name: N/A  . Number of children: 2  . Years of education: N/A   Occupational History  . Line supervisor     [employer]   Social History Main Topics  . Smoking status: Never Smoker  . Smokeless tobacco: Never Used  . Alcohol use 0.0 oz/week     Comment: Occassionally  . Drug use: No  . Sexual activity: Not on file   Other Topics Concern  . Not on file   Social History Narrative   Walks and enjoys golf.   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 No distress.  HENT:  Mouth/Throat: Oropharynx is clear and moist. No oropharyngeal exudate.  No sinus tenderness Moderate nasal congestion TMs normal   Neck: Normal range of motion. No thyromegaly present.  Pulmonary/Chest: Effort normal and breath sounds normal. No respiratory distress.  He has no wheezes. He has no rales.  Lymphadenopathy:    He has no cervical adenopathy.          Assessment & Plan:

## 2016-07-09 ENCOUNTER — Telehealth: Payer: Self-pay

## 2016-07-09 MED ORDER — ALBUTEROL SULFATE HFA 108 (90 BASE) MCG/ACT IN AERS: 2.0000 | INHALATION_SPRAY | RESPIRATORY_TRACT | 1 refills | Status: DC

## 2016-07-09 NOTE — Telephone Encounter (Signed)
Pt walked in; pt lives in [place] but works in [place]. Pt was seen 06/24/16 and given doxycycline if needed which pt did get filled and just recently finished abx.Dr Silvio Pate advised could take 6 wk time course. On 06/29/16 pt felt very anxious and could not breathe well and went to [facility] ED; pt had CT scans and labs and other testing but ED doctor could not find anything wrong but sent pt to see Dr Ernst Bowler pulmonologist for [facility] in [place] on 07/06/16. Pt said lung function test was 1/2 and was given prednisone taper and symbicort inhaler with spacer to use Bid. Pt is presently taking prednisone 40 mg daily this week. Today pt was at work and did not feel could get deep breath and felt anxious,. Dr Ernst Bowler is not in office today but pt's wife scheduled appt to see Dr Ernst Bowler on 07/13/16. Today no fever, h/a,dizziness,CP or SOB. Pt said has started with prod cough with minimal clear sputum and pt is clearing his throat a lot. Pt feels OK now but concerned about tonight and this weekend with no access to a doctor except ED. Pt stated he wants to be able to get a deep breath and not feel as anxious. Dr Silvio Pate said to decrease Prednisone to 20 mg a day and send in albuterol inhaler (use spacer with albuterol inhaler) two puffs q4h with 1 refill. Today BP was 130/80, P 61 and pulse ox 93%. If pt condition worsens prior to seeing Dr Ernst Bowler he will go to ED. Pt voiced understanding of instructions. [facility]. FYI to Dr Silvio Pate.

## 2016-07-10 NOTE — Telephone Encounter (Signed)
Please check on him Monday morning

## 2016-07-12 NOTE — Telephone Encounter (Signed)
Left message on VM to call office.

## 2016-08-05 LAB — PULMONARY FUNCTION TEST

## 2016-08-11 IMAGING — DX DG HIP (WITH OR WITHOUT PELVIS) 2-3V*R*
3 series · 3 of 3 positions shown · non-contrast
Comparison: None.

CLINICAL DATA: Right groin pain, initial encounter

EXAM:
DG HIP (WITH OR WITHOUT PELVIS) 2-3V RIGHT

[pelvis ap]
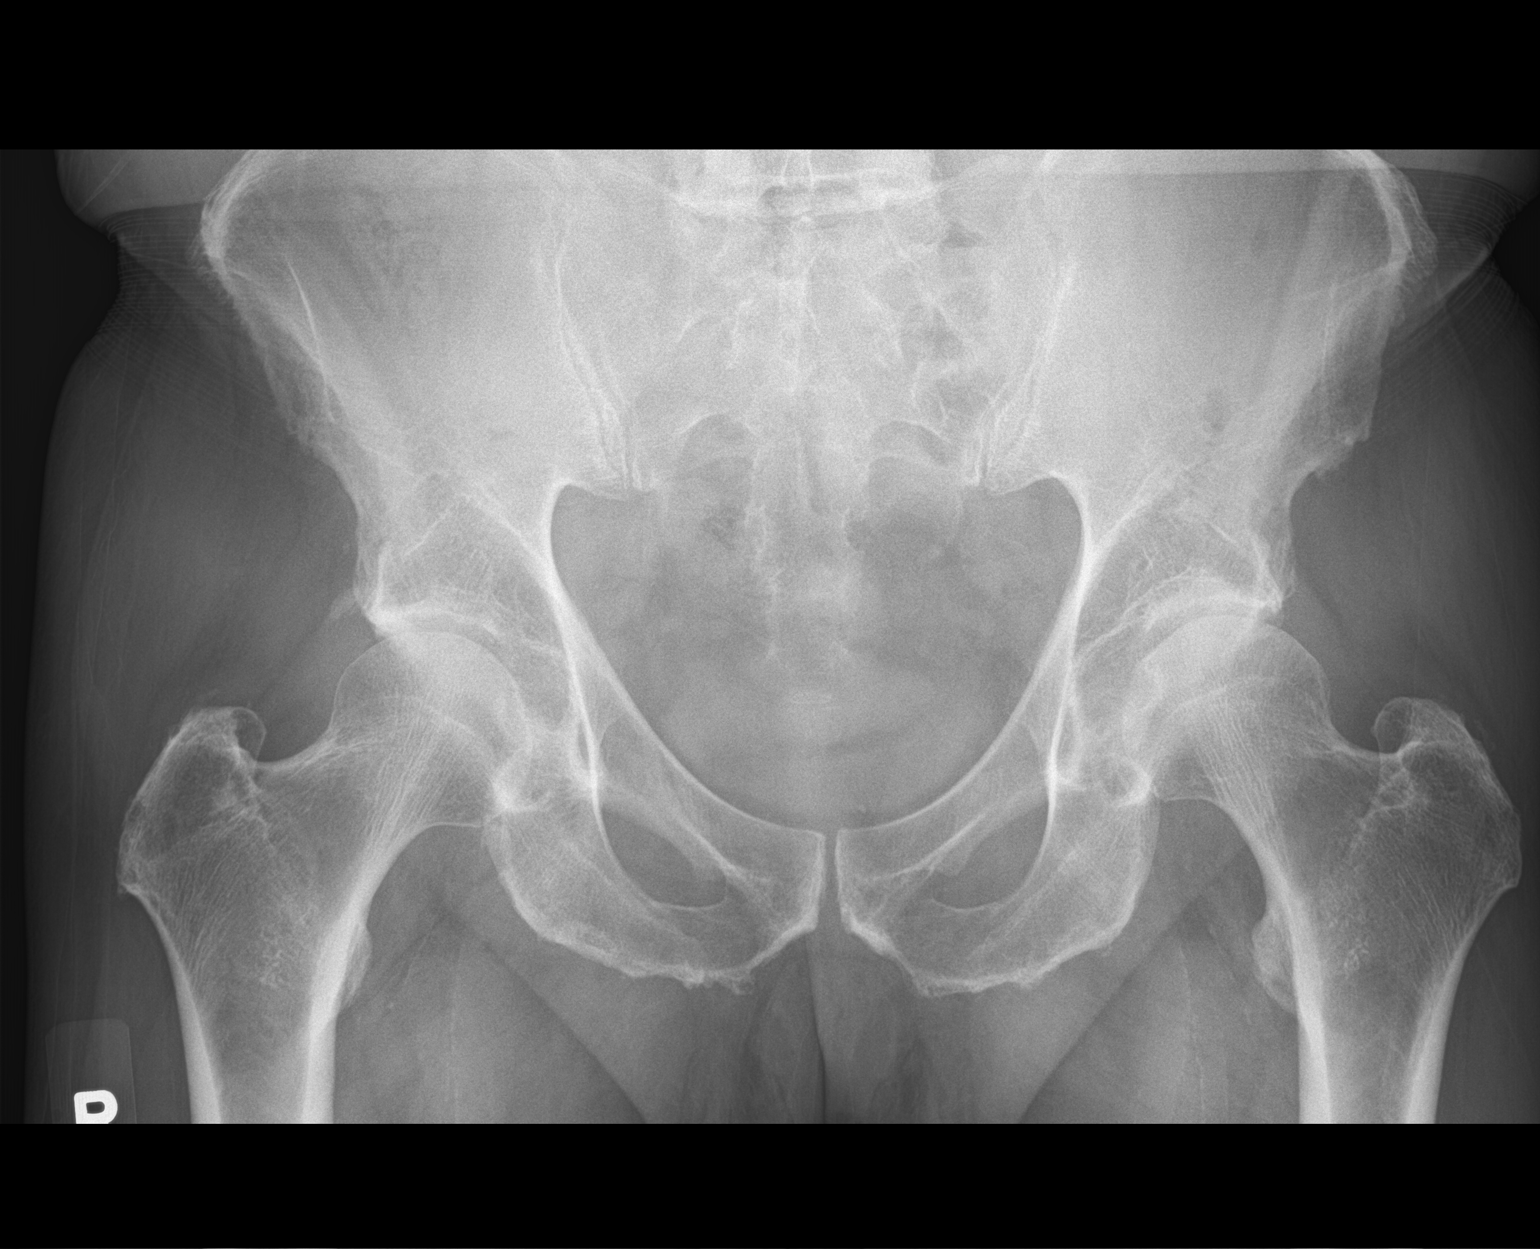

[hip ap]
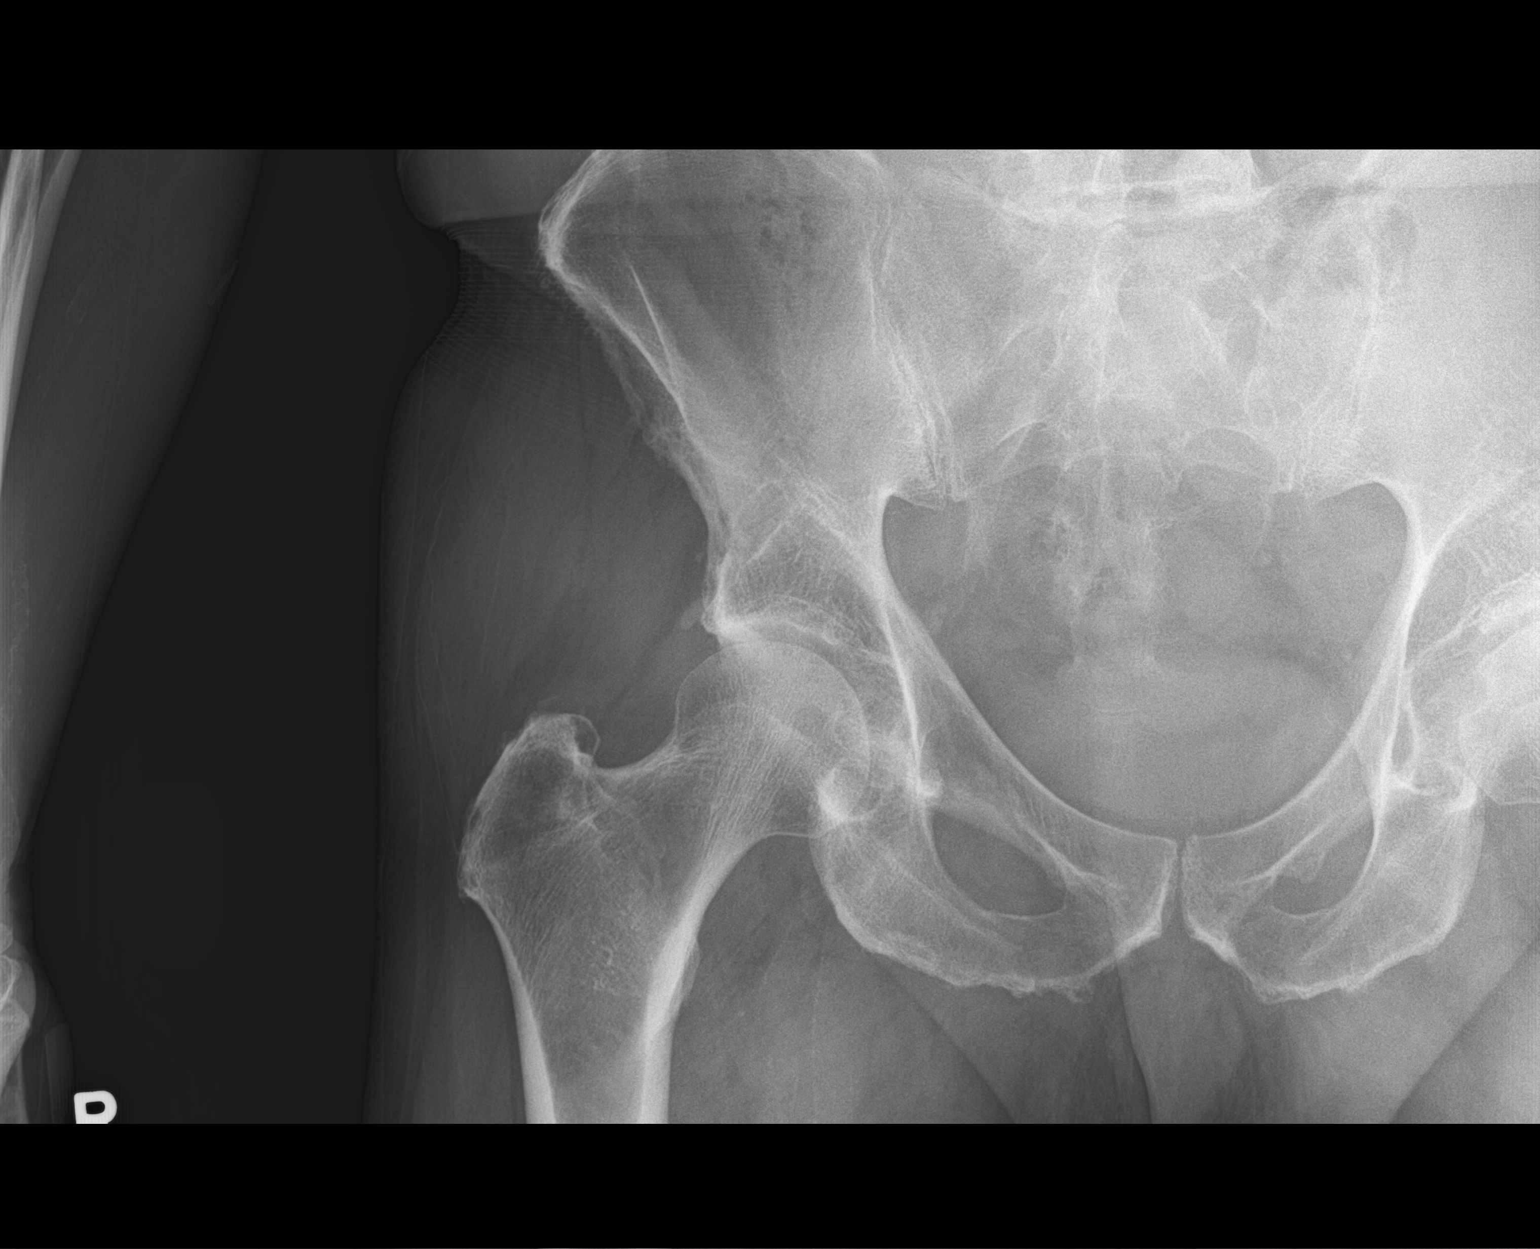

[hip lat]
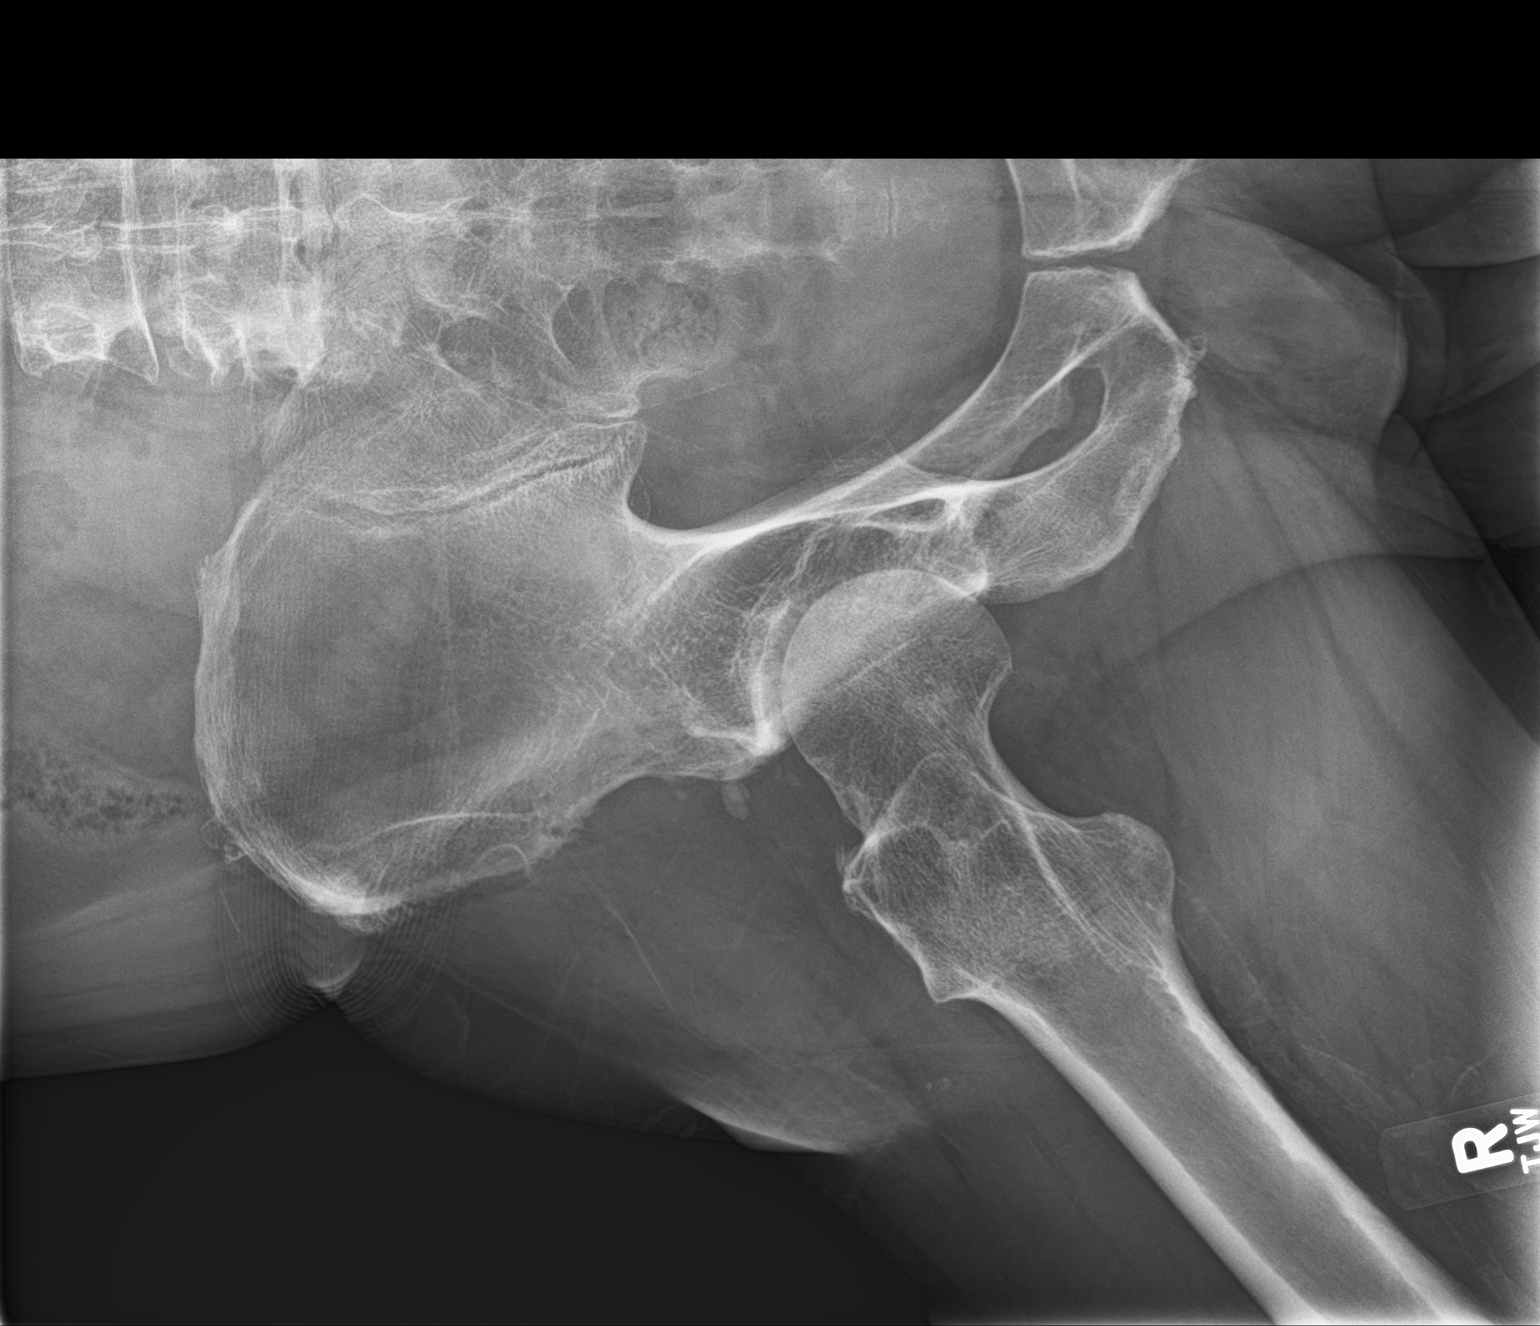

[3 of 3 positions shown; findings below may reference images not displayed]

FINDINGS: Pelvic ring is intact. Degenerative changes of the hip joints noted
bilaterally. No acute fracture or dislocation is noted. No gross
soft tissue abnormality is seen.
IMPRESSION: Degenerative change without acute abnormality.

## 2017-02-03 DIAGNOSIS — D2272 Melanocytic nevi of left lower limb, including hip: Secondary | ICD-10-CM | POA: Diagnosis not present

## 2017-02-03 DIAGNOSIS — D2271 Melanocytic nevi of right lower limb, including hip: Secondary | ICD-10-CM | POA: Diagnosis not present

## 2017-02-03 DIAGNOSIS — D2261 Melanocytic nevi of right upper limb, including shoulder: Secondary | ICD-10-CM | POA: Diagnosis not present

## 2017-02-03 DIAGNOSIS — D225 Melanocytic nevi of trunk: Secondary | ICD-10-CM | POA: Diagnosis not present

## 2017-02-03 DIAGNOSIS — D2262 Melanocytic nevi of left upper limb, including shoulder: Secondary | ICD-10-CM | POA: Diagnosis not present

## 2017-02-03 DIAGNOSIS — Z85820 Personal history of malignant melanoma of skin: Secondary | ICD-10-CM | POA: Diagnosis not present

## 2017-02-03 DIAGNOSIS — L814 Other melanin hyperpigmentation: Secondary | ICD-10-CM | POA: Diagnosis not present

## 2017-02-03 DIAGNOSIS — L821 Other seborrheic keratosis: Secondary | ICD-10-CM | POA: Diagnosis not present

## 2017-02-03 DIAGNOSIS — L570 Actinic keratosis: Secondary | ICD-10-CM | POA: Diagnosis not present

## 2017-02-03 DIAGNOSIS — D1801 Hemangioma of skin and subcutaneous tissue: Secondary | ICD-10-CM | POA: Diagnosis not present

## 2017-02-10 ENCOUNTER — Ambulatory Visit: Payer: Managed Care, Other (non HMO) | Admitting: Internal Medicine

## 2017-02-10 ENCOUNTER — Ambulatory Visit (INDEPENDENT_AMBULATORY_CARE_PROVIDER_SITE_OTHER): Payer: Managed Care, Other (non HMO) | Admitting: Internal Medicine

## 2017-02-10 ENCOUNTER — Encounter: Payer: Self-pay | Admitting: Internal Medicine

## 2017-02-10 VITALS — BP 138/86 | HR 71 | Temp 98.4°F | Wt 191.0 lb

## 2017-02-10 DIAGNOSIS — N6310 Unspecified lump in the right breast, unspecified quadrant: Secondary | ICD-10-CM | POA: Diagnosis not present

## 2017-02-10 NOTE — Assessment & Plan Note (Signed)
Present for 3 weeks but seems to be getting smaller No concerning features on PE Likely traumatic Has yearly exam in 1 month--will recheck then refer for surgical evaluation if not reassured

## 2017-02-10 NOTE — Progress Notes (Signed)
   Subjective:    Patient ID: Johnathan Lambert, male    DOB: June 17, 1950, 66 y.o.   MRN: [id number]  HPI Here due to concern about a breast mass  Noticed a lump in his right breast about 3 weeks ago [place] the beach, and playing with grandkids in the pool Noticed it after that--not sure if he had some trauma Seems smaller now Slightly sore  Current Outpatient Prescriptions on File Prior to Visit  Medication Sig Dispense Refill  . aspirin 81 MG tablet Take 81 mg by mouth daily.      Marland Kitchen atorvastatin (LIPITOR) 20 MG tablet Take 1 tablet (20 mg total) by mouth daily. 90 tablet 2  . Colchicine 0.6 MG CAPS Take 1 capsule by mouth 2 (two) times daily as needed. 60 capsule 11  . Multiple Vitamin (MULTIVITAMIN) tablet Take 1 tablet by mouth daily.      . sildenafil (REVATIO) 20 MG tablet Take 3-5 tablets (60-100 mg total) by mouth daily as needed. 50 tablet 11   No current facility-administered medications on file prior to visit.     No Known Allergies  Past Medical History:  Diagnosis Date  . Broken arm 1990   cast  . FH: colonic polyps   . Gout   . Hyperlipidemia   . Melanoma of back ([place]) 1/16    Past Surgical History:  Procedure Laterality Date  . CATARACT EXTRACTION W/ INTRAOCULAR LENS  IMPLANT, BILATERAL  3/12   both eyes  . COLONOSCOPY    . SHOULDER SURGERY  1985   Right    Family History  Problem Relation Age of Onset  . Hyperlipidemia Mother   . Hyperthyroidism Mother   . Heart disease Father 58       MI and CAD  . Hyperthyroidism Sister   . Hyperthyroidism Brother   . Cancer Paternal Uncle        lung  . Colon cancer Neg Hx   . Esophageal cancer Neg Hx   . Rectal cancer Neg Hx   . Stomach cancer Neg Hx     Social History   Social History  . Marital status: Married    Spouse name: N/A  . Number of children: 2  . Years of education: N/A   Occupational History  . Line supervisor     [employer]   Social History Main Topics  . Smoking status: Never Smoker  .  Smokeless tobacco: Never Used  . Alcohol use 0.0 oz/week     Comment: Occassionally  . Drug use: No  . Sexual activity: Not on file   Other Topics Concern  . Not on file   Social History Narrative   Walks and enjoys golf.   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 Not really tender Left breast negative  Lymphadenopathy:    He has no axillary adenopathy.          Assessment & Plan:

## 2017-02-18 ENCOUNTER — Ambulatory Visit: Payer: Managed Care, Other (non HMO) | Admitting: Internal Medicine

## 2017-03-01 ENCOUNTER — Other Ambulatory Visit: Payer: Self-pay | Admitting: Internal Medicine

## 2017-03-18 ENCOUNTER — Telehealth: Payer: Self-pay | Admitting: General Surgery

## 2017-03-18 ENCOUNTER — Encounter: Payer: Self-pay | Admitting: Internal Medicine

## 2017-03-18 ENCOUNTER — Ambulatory Visit (INDEPENDENT_AMBULATORY_CARE_PROVIDER_SITE_OTHER): Payer: Managed Care, Other (non HMO) | Admitting: Internal Medicine

## 2017-03-18 VITALS — BP 150/82 | HR 64 | Temp 97.7°F | Ht 69.0 in | Wt 193.2 lb

## 2017-03-18 DIAGNOSIS — N6310 Unspecified lump in the right breast, unspecified quadrant: Secondary | ICD-10-CM

## 2017-03-18 DIAGNOSIS — E785 Hyperlipidemia, unspecified: Secondary | ICD-10-CM

## 2017-03-18 DIAGNOSIS — Z0000 Encounter for general adult medical examination without abnormal findings: Secondary | ICD-10-CM | POA: Diagnosis not present

## 2017-03-18 DIAGNOSIS — M1000 Idiopathic gout, unspecified site: Secondary | ICD-10-CM | POA: Diagnosis not present

## 2017-03-18 DIAGNOSIS — Z125 Encounter for screening for malignant neoplasm of prostate: Secondary | ICD-10-CM | POA: Diagnosis not present

## 2017-03-18 LAB — CBC WITH DIFFERENTIAL/PLATELET
BASOS ABS: 0.1 10*3/uL (ref 0.0–0.1)
BASOS PCT: 1 % (ref 0.0–3.0)
EOS ABS: 0.4 10*3/uL (ref 0.0–0.7)
Eosinophils Relative: 6.8 % — ABNORMAL HIGH (ref 0.0–5.0)
HEMATOCRIT: 41.1 % (ref 39.0–52.0)
HEMOGLOBIN: 13.7 g/dL (ref 13.0–17.0)
LYMPHS PCT: 26 % (ref 12.0–46.0)
Lymphs Abs: 1.4 10*3/uL (ref 0.7–4.0)
MCHC: 33.4 g/dL (ref 30.0–36.0)
MCV: 96 fl (ref 78.0–100.0)
MONO ABS: 0.3 10*3/uL (ref 0.1–1.0)
Monocytes Relative: 6.3 % (ref 3.0–12.0)
Neutro Abs: 3.2 10*3/uL (ref 1.4–7.7)
Neutrophils Relative %: 59.9 % (ref 43.0–77.0)
Platelets: 236 10*3/uL (ref 150.0–400.0)
RBC: 4.28 Mil/uL (ref 4.22–5.81)
RDW: 13.6 % (ref 11.5–15.5)
WBC: 5.4 10*3/uL (ref 4.0–10.5)

## 2017-03-18 LAB — COMPREHENSIVE METABOLIC PANEL
ALBUMIN: 4.5 g/dL (ref 3.5–5.2)
ALK PHOS: 68 U/L (ref 39–117)
ALT: 22 U/L (ref 0–53)
AST: 20 U/L (ref 0–37)
BILIRUBIN TOTAL: 0.6 mg/dL (ref 0.2–1.2)
BUN: 12 mg/dL (ref 6–23)
CALCIUM: 9.6 mg/dL (ref 8.4–10.5)
CO2: 29 meq/L (ref 19–32)
CREATININE: 0.77 mg/dL (ref 0.40–1.50)
Chloride: 102 mEq/L (ref 96–112)
GFR: 107.42 mL/min (ref >60.00)
Glucose, Bld: 97 mg/dL (ref 70–99)
Potassium: 4.5 mEq/L (ref 3.5–5.1)
Sodium: 141 mEq/L (ref 135–145)
Total Protein: 7.1 g/dL (ref 6.0–8.3)

## 2017-03-18 LAB — PSA: PSA: 0.42 ng/mL (ref 0.10–4.00)

## 2017-03-18 LAB — LIPID PANEL
CHOL/HDL RATIO: 3
CHOLESTEROL: 217 mg/dL — AB (ref 0–200)
HDL: 66.2 mg/dL (ref >39.00)
NonHDL: 150.95
TRIGLYCERIDES: 335 mg/dL — AB (ref 0.0–149.0)
VLDL: 67 mg/dL — ABNORMAL HIGH (ref 0.0–40.0)

## 2017-03-18 LAB — LDL CHOLESTEROL, DIRECT: Direct LDL: 108 mg/dL

## 2017-03-18 MED ORDER — ATORVASTATIN CALCIUM 20 MG PO TABS: 20.0000 mg | ORAL_TABLET | Freq: Every day | ORAL | 3 refills | Status: DC

## 2017-03-18 MED ORDER — COLCHICINE 0.6 MG PO CAPS: 1.0000 | ORAL_CAPSULE | Freq: Two times a day (BID) | ORAL | 11 refills | Status: DC | PRN

## 2017-03-18 NOTE — Assessment & Plan Note (Signed)
No problems with primary prevention Will check labs

## 2017-03-18 NOTE — Progress Notes (Signed)
Subjective:    Patient ID: Johnathan Lambert, male    DOB: 1951-05-15, 66 y.o.   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 aspirin 81 MG tablet Take 81 mg by mouth daily.      Marland Kitchen atorvastatin (LIPITOR) 20 MG tablet TAKE 1 TABLET (20 MG TOTAL) BY MOUTH DAILY. 90 tablet 2  . Colchicine 0.6 MG CAPS Take 1 capsule by mouth 2 (two) times daily as needed. 60 capsule 11  . Multiple Vitamin (MULTIVITAMIN) tablet Take 1 tablet by mouth daily.      . sildenafil (REVATIO) 20 MG tablet Take 3-5 tablets (60-100 mg total) by mouth daily as needed. 50 tablet 11   No current facility-administered medications on file prior to visit.     No Known Allergies  Past Medical History:  Diagnosis Date  . Broken arm 1990   cast  . FH: colonic polyps   . Gout   . Hyperlipidemia   . Melanoma of back ([place]) 1/16    Past Surgical History:  Procedure Laterality Date  . CATARACT EXTRACTION W/ INTRAOCULAR LENS  IMPLANT, BILATERAL  3/12   both eyes  . COLONOSCOPY    . SHOULDER SURGERY  1985   Right    Family History  Problem Relation Age of Onset  . Hyperlipidemia Mother   . Hyperthyroidism Mother   . Heart disease Father 6       MI and CAD  . Hyperthyroidism Sister   . Hyperthyroidism Brother   . Cancer Paternal Uncle        lung  . Colon cancer Neg Hx   . Esophageal cancer Neg Hx   . Rectal cancer Neg Hx   . Stomach cancer Neg Hx        Review of Systems  Constitutional: Negative for fatigue.       Weight up 5# from  last year Wears seat belt Not much exercise  HENT: Negative for dental problem, hearing loss, tinnitus and trouble swallowing.        Keeps up with dentist  Eyes: Negative for visual disturbance.       No diplopia or unilateral vision loss Glaucoma suspect on left  Respiratory: Negative for cough, chest tightness, shortness of breath and wheezing.   Cardiovascular: Negative for chest pain, palpitations and leg swelling.  Gastrointestinal: Negative for blood in stool and constipation.       No heartburn  Endocrine: Negative for polydipsia and polyuria.  Genitourinary: Negative for difficulty urinating and urgency.       Still satisfied with sildenafil  Musculoskeletal: Positive for arthralgias. Negative for back pain and joint swelling.       Ongoing right hip pain-- injections helped only briefly Had PT also and doing exercises  Skin: Negative for rash.       Has spot on upper chest to check--did just have dermatology appt  Allergic/Immunologic: Positive for environmental allergies. Negative for immunocompromised state.       Mild --- no meds  Neurological:  Negative for dizziness, syncope and light-headedness.       Some numbness down right leg  Hematological: Negative for adenopathy. Does not bruise/bleed easily.  Psychiatric/Behavioral: Negative for dysphoric mood and sleep disturbance. The patient is not nervous/anxious.        Still only sleeps 4-5 hours a night--not fatigued       Objective:   Physical Exam  Constitutional: He is oriented to person, place, and time. He appears well-developed and well-nourished. No distress.  HENT:  Head: Normocephalic and atraumatic.  Right Ear: External ear normal.  Left Ear: External ear normal.  Mouth/Throat: Oropharynx is clear and moist. No oropharyngeal exudate.  Eyes: Pupils are equal, round, and reactive to light. Conjunctivae are normal.  Neck: Normal range of motion. No thyromegaly present.  Cardiovascular: Normal rate, regular  rhythm, normal heart sounds and intact distal pulses.  Exam reveals no gallop.   No murmur heard. Pulmonary/Chest: Effort normal and breath sounds normal. No respiratory distress. He has no wheezes. He has no rales.  Abdominal: Soft. There is no tenderness.  Genitourinary:  Genitourinary Comments: Firm right subareolar mass  Musculoskeletal: He exhibits no edema or tenderness.  Lymphadenopathy:    He has no cervical adenopathy.  Neurological: He is alert and oriented to person, place, and time.  Skin: No rash noted. No erythema.  Psychiatric: He has a normal mood and affect. His behavior is normal.          Assessment & Plan:

## 2017-03-18 NOTE — Assessment & Plan Note (Signed)
Rarely uses the med

## 2017-03-18 NOTE — Telephone Encounter (Signed)
I CALLED PATIENT & LEFT A MESSAGE FOR HIM TO CALL TO SCHEDULE AN APPOINTMENT WITH DR Jamal Collin FOR A RT BR MASS.NO STUDIES,REF MD DR Viviana Simpler.[email or web address].NEW PATIENT

## 2017-03-18 NOTE — Assessment & Plan Note (Signed)
Still there---will get second opinion

## 2017-03-18 NOTE — Assessment & Plan Note (Signed)
Healthy Ongoing hip pain---may need second opinion (will send to [facility] prn) BP up some--discussed fitness Colon due 2020 Discussed PSA---will check Hold off on pneumovax--worried prevnar caused respiratory issues Flu vaccine at work

## 2017-03-23 ENCOUNTER — Encounter: Payer: Self-pay | Admitting: General Surgery

## 2017-03-23 ENCOUNTER — Ambulatory Visit (INDEPENDENT_AMBULATORY_CARE_PROVIDER_SITE_OTHER): Payer: Managed Care, Other (non HMO) | Admitting: General Surgery

## 2017-03-23 VITALS — BP 142/82 | HR 66 | Resp 12 | Ht 69.0 in | Wt 188.0 lb

## 2017-03-23 DIAGNOSIS — N62 Hypertrophy of breast: Secondary | ICD-10-CM | POA: Diagnosis not present

## 2017-03-23 NOTE — Progress Notes (Signed)
Patient ID: Johnathan Lambert, male   DOB: 08-19-1950, 66 y.o.   MRN: [id number]  Chief Complaint  Patient presents with  . Breast Problem    HPI Johnathan Lambert is a 66 y.o. male. Here for right breast mass referred by Dr Silvio Pate. He noticed soreness to right breast back in August. He thought it was related to swimming in the pool with the grand kids and one of them bumped against him. It does not seem to have changed in size about the size of a quarter. Denies starting any new medications.    HPI  Past Medical History:  Diagnosis Date  . Broken arm 1990   cast  . FH: colonic polyps   . Gout   . Hyperlipidemia   . Melanoma of back ([place]) 1/16    Past Surgical History:  Procedure Laterality Date  . CATARACT EXTRACTION W/ INTRAOCULAR LENS  IMPLANT, BILATERAL  3/12   both eyes  . COLONOSCOPY    . SHOULDER SURGERY  1985   Right    Family History  Problem Relation Age of Onset  . Hyperlipidemia Mother   . Hyperthyroidism Mother   . Heart disease Father 31       MI and CAD  . Hyperthyroidism Sister   . Hyperthyroidism Brother   . Cancer Paternal Uncle        lung  . Colon cancer Neg Hx   . Esophageal cancer Neg Hx   . Rectal cancer Neg Hx   . Stomach cancer Neg Hx     Social History Social History  Substance Use Topics  . Smoking status: Never Smoker  . Smokeless tobacco: Never Used  . Alcohol use 0.0 oz/week     Comment: Occassionally    No Known Allergies  Current Outpatient Prescriptions  Medication Sig Dispense Refill  . aspirin 81 MG tablet Take 81 mg by mouth daily.      Marland Kitchen atorvastatin (LIPITOR) 20 MG tablet Take 1 tablet (20 mg total) by mouth daily. 90 tablet 3  . Colchicine 0.6 MG CAPS Take 1 capsule by mouth 2 (two) times daily as needed. 60 capsule 11  . latanoprost (XALATAN) 0.005 % ophthalmic solution Place 1 drop into both eyes at bedtime.  12  . Multiple Vitamin (MULTIVITAMIN) tablet Take 1 tablet by mouth daily.      . sildenafil (REVATIO) 20 MG  tablet Take 3-5 tablets (60-100 mg total) by mouth daily as needed. 50 tablet 11   No current facility-administered medications for this visit.     Review of Systems Review of Systems  Constitutional: Negative.   Respiratory: Negative.   Cardiovascular: Negative.     Blood pressure (!) 142/82, pulse 66, resp. rate 12, height 5\' 9"  (1.753 m), weight 188 lb (85.3 kg).  Physical Exam Physical Exam  Constitutional: He is oriented to person, place, and time. He appears well-developed and well-nourished.  HENT:  Mouth/Throat: Oropharynx is clear and moist.  Eyes: Conjunctivae are normal. No scleral icterus.  Neck: Neck supple. No thyromegaly present.  Cardiovascular: Normal rate, regular rhythm and normal heart sounds.   Pulmonary/Chest: Effort normal and breath sounds normal. No respiratory distress. Right breast exhibits no inverted nipple, no mass, no nipple discharge, no skin change and no tenderness. Left breast exhibits no inverted nipple, no mass, no nipple discharge, no skin change and no tenderness.    Abdominal: Soft. There is no tenderness.  Lymphadenopathy:    He has no cervical adenopathy.  He has no axillary adenopathy.  Neurological: He is alert and oriented to person, place, and time.  Skin: Skin is warm and dry.  Psychiatric: He has a normal mood and affect. His behavior is normal.    Data Reviewed None  Assessment    Gynecomastia of right breast - onset in August with mild discomfort. No defined mass palpable on physical exam.     Plan   Pt advised Monitor for any changes and return to clinic in 6-8 weeks. Will consider [place] if any further enlargement     HPI, Physical Exam, Assessment and Plan have been scribed under the direction and in the presence of Mckinley Jewel, MD Karie Fetch, RN  I have completed the exam and reviewed the above documentation for accuracy and completeness.  I agree with the above.  Haematologist has been used and any errors in  dictation or transcription are unintentional.  [name], M.D., F.A.C.S.   Junie Panning G 03/23/2017, 11:16 AM

## 2017-03-23 NOTE — Patient Instructions (Signed)
Monitor for any changes and return to clinic in 6-8 weeks.

## 2017-04-12 ENCOUNTER — Encounter: Payer: Self-pay | Admitting: Internal Medicine

## 2017-04-12 DIAGNOSIS — M25559 Pain in unspecified hip: Secondary | ICD-10-CM

## 2017-04-25 ENCOUNTER — Ambulatory Visit (INDEPENDENT_AMBULATORY_CARE_PROVIDER_SITE_OTHER): Payer: Managed Care, Other (non HMO) | Admitting: Orthopaedic Surgery

## 2017-04-25 ENCOUNTER — Ambulatory Visit (INDEPENDENT_AMBULATORY_CARE_PROVIDER_SITE_OTHER): Payer: Managed Care, Other (non HMO)

## 2017-04-25 ENCOUNTER — Encounter (INDEPENDENT_AMBULATORY_CARE_PROVIDER_SITE_OTHER): Payer: Self-pay | Admitting: Orthopaedic Surgery

## 2017-04-25 DIAGNOSIS — M79604 Pain in right leg: Secondary | ICD-10-CM | POA: Diagnosis not present

## 2017-04-25 DIAGNOSIS — M25551 Pain in right hip: Secondary | ICD-10-CM

## 2017-04-25 NOTE — Progress Notes (Signed)
Office Visit Note   Patient: Johnathan Lambert           Date of Birth: Oct 12, 1950           MRN: [id number] Visit Date: 04/25/2017              Requested by: Venia Carbon, MD [place],  [id number] PCP: Venia Carbon, MD   Assessment & Plan: Visit Diagnoses:  1. Pain in right leg   2. Pain in right hip     Plan: Based on the x-rays of his pelvis and right hip combined with his physical exam I do not feel that he is in the need of hip replacement surgery at all and I do not see this in his near future either.  Obviously if he continues to have any hip pain that starts worsening we could always obtain an MRI to assess the cartilage more fully but based on clinical exam and x-rays does not appear to have any type of significant arthritis at all in that hip.  He was pleased to hear this and said he can follow-up as needed but if he has any issues he knows where to find this.  Follow-Up Instructions: Return if symptoms worsen or fail to improve.   Orders:  Orders Placed This Encounter  Procedures  . XR Lumbar Spine 2-3 Views  . XR HIP UNILAT W OR W/O PELVIS 1V RIGHT   No orders of the defined types were placed in this encounter.     Procedures: No procedures performed   Clinical Data: No additional findings.   Subjective: Chief Complaint  Patient presents with  . Right Hip - Pain  . Right Leg - Pain  The patient is some I am seeing for the first time.  He is a very active 66 year old play sports for all of his life.  He is concerned about his right hip in terms of whether or not he may need surgery and he would like to do this before he retires next year.  He is been told he had hip bursitis has had some sciatica in the past and nothing is hurting terribly.  He has had some groin pain on the right side before but not terribly.  He said to intra-articular steroid injections and one over the greater trochanter on the right side over a long period  of time.  He is also been treated with steroids for sciatica.  He says today his pain is minimal and he mainly just needs reassurance of whether or not he may need a hip replacement.  He occasionally gets numbness and tingling going down his leg as well.  He points more to the trochanteric area as a source of his pain today in the IT band.  HPI  Review of Systems He currently denies any headache, chest pain, shortness of breath, fever, chills, nausea, vomiting  Objective: Vital Signs: There were no vitals taken for this visit.  Physical Exam He is alert and oriented x3 and in no acute distress Ortho Exam Examination of his right hip is basically normal today other than some mild pain over the trochanteric area.  He has full internal/external rotation with no pain in the groin at all.  His right knee exam is normal.  His leg lengths are equal.  He has 5 out of 5 strength in all muscle groups and normal sensation all dermatomes.  His back exam shows excellent range of motion  with flexion and extension with no significant findings. Specialty Comments:  No specialty comments available.  Imaging: Xr Hip Unilat W Or W/o Pelvis 1v Right  Result Date: 04/25/2017 An AP pelvis and lateral of his right hip shows no significant arthritic findings in the hip joint itself.  The hip joint is very well-maintained.  There is no cortical irregularities around the hip or the greater trochanter.  Xr Lumbar Spine 2-3 Views  Result Date: 04/25/2017 2 views of the lumbar spine show arthritic changes at multiple levels but no severe deformities and no significant malalignment.  There is no acute findings.    PMFS History: Patient Active Problem List   Diagnosis Date Noted  . Breast mass, right 02/10/2017  . Routine general medical examination at a health care facility 01/28/2011  . Gout 11/28/2009  . ACTINIC KERATOSIS 06/05/2008  . Hyperlipemia 01/04/2007  . COLONIC POLYPS, HX OF 01/04/2007   Past  Medical History:  Diagnosis Date  . Broken arm 1990   cast  . FH: colonic polyps   . Gout   . Hyperlipidemia   . Melanoma of back ([place]) 1/16    Family History  Problem Relation Age of Onset  . Hyperlipidemia Mother   . Hyperthyroidism Mother   . Heart disease Father 37       MI and CAD  . Hyperthyroidism Sister   . Hyperthyroidism Brother   . Cancer Paternal Uncle        lung  . Colon cancer Neg Hx   . Esophageal cancer Neg Hx   . Rectal cancer Neg Hx   . Stomach cancer Neg Hx     Past Surgical History:  Procedure Laterality Date  . CATARACT EXTRACTION W/ INTRAOCULAR LENS  IMPLANT, BILATERAL  3/12   both eyes  . COLONOSCOPY    . SHOULDER SURGERY  1985   Right   Social History   Occupational History  . Occupation: Science writer    Comment: [employer]  Tobacco Use  . Smoking status: Never Smoker  . Smokeless tobacco: Never Used  Substance and Sexual Activity  . Alcohol use: Yes    Alcohol/week: 0.0 oz    Comment: Occassionally  . Drug use: No  . Sexual activity: Not on file

## 2017-05-04 ENCOUNTER — Encounter: Payer: Self-pay | Admitting: General Surgery

## 2017-05-04 ENCOUNTER — Ambulatory Visit (INDEPENDENT_AMBULATORY_CARE_PROVIDER_SITE_OTHER): Payer: Managed Care, Other (non HMO) | Admitting: General Surgery

## 2017-05-04 VITALS — BP 128/76 | HR 74 | Resp 14 | Ht 69.0 in | Wt 192.0 lb

## 2017-05-04 DIAGNOSIS — N62 Hypertrophy of breast: Secondary | ICD-10-CM

## 2017-05-04 NOTE — Patient Instructions (Signed)
Patient to return as needed. The patient is aware to call back for any questions or concerns. 

## 2017-05-04 NOTE — Progress Notes (Signed)
Patient ID: Johnathan Lambert, male   DOB: Aug 06, 1950, 66 y.o.   MRN: [id number]  Chief Complaint  Patient presents with  . Follow-up    HPI Johnathan Lambert is a 66 y.o. male here today for his six week follow up gynecomastia. He states the area is gone away, no pain or swelling. Patient state he remembers before this last visit he got a shot in his right hip every 30 days and ever since he stop the shots the area went down.                               Marland KitchenHPI  Past Medical History:  Diagnosis Date  . Broken arm 1990   cast  . FH: colonic polyps   . Gout   . Hyperlipidemia   . Melanoma of back ([place]) 1/16    Past Surgical History:  Procedure Laterality Date  . CATARACT EXTRACTION W/ INTRAOCULAR LENS  IMPLANT, BILATERAL  3/12   both eyes  . COLONOSCOPY    . SHOULDER SURGERY  1985   Right    Family History  Problem Relation Age of Onset  . Hyperlipidemia Mother   . Hyperthyroidism Mother   . Heart disease Father 24       MI and CAD  . Hyperthyroidism Sister   . Hyperthyroidism Brother   . Cancer Paternal Uncle        lung  . Colon cancer Neg Hx   . Esophageal cancer Neg Hx   . Rectal cancer Neg Hx   . Stomach cancer Neg Hx     Social History Social History   Tobacco Use  . Smoking status: Never Smoker  . Smokeless tobacco: Never Used  Substance Use Topics  . Alcohol use: Yes    Alcohol/week: 0.0 oz    Comment: Occassionally  . Drug use: No    No Known Allergies  Current Outpatient Medications  Medication Sig Dispense Refill  . aspirin 81 MG tablet Take 81 mg by mouth daily.      Marland Kitchen atorvastatin (LIPITOR) 20 MG tablet Take 1 tablet (20 mg total) by mouth daily. 90 tablet 3  . Colchicine 0.6 MG CAPS Take 1 capsule by mouth 2 (two) times daily as needed. 60 capsule 11  . latanoprost (XALATAN) 0.005 % ophthalmic solution Place 1 drop into both eyes at bedtime.  12  . Multiple Vitamin (MULTIVITAMIN) tablet Take 1 tablet by mouth daily.      . sildenafil (REVATIO) 20  MG tablet Take 3-5 tablets (60-100 mg total) by mouth daily as needed. 50 tablet 11   No current facility-administered medications for this visit.     Review of Systems Review of Systems  Constitutional: Negative.   Respiratory: Negative.   Cardiovascular: Negative.     Blood pressure 128/76, pulse 74, resp. rate 14, height 5\' 9"  (1.753 m), weight 192 lb (87.1 kg).  Physical Exam Physical Exam  Constitutional: He is oriented to person, place, and time. He appears well-developed and well-nourished.  Eyes: Conjunctivae are normal. No scleral icterus.  Neck: Neck supple.  Pulmonary/Chest: Right breast exhibits no inverted nipple, no mass, no nipple discharge, no skin change and no tenderness. Left breast exhibits no inverted nipple, no mass, no nipple discharge, no skin change and no tenderness.  Lymphadenopathy:    He has no cervical adenopathy.  Neurological: He is alert and oriented to person, place, and time.  Skin:  Skin is warm and dry.    Data Reviewed Prior notes reviewed   Assessment      Right breast gynecomastia resolved. Possible he received steroid injections to his hip which may have caused the gynecomastia. Pt advised  Plan    Patient to return as needed. The patient is aware to call back for any questions or concerns.   HPI, Physical Exam, Assessment and Plan have been scribed under the direction and in the presence of Mckinley Jewel, MD  Gaspar Cola, CMA     I have completed the exam and reviewed the above documentation for accuracy and completeness.  I agree with the above.  Haematologist has been used and any errors in dictation or transcription are unintentional.  Charlestine Rookstool G. Jamal Collin, M.D., F.A.C.S.   Junie Panning G 05/09/2017, 4:32 PM

## 2017-05-19 ENCOUNTER — Other Ambulatory Visit: Payer: Self-pay | Admitting: Internal Medicine

## 2018-02-06 DIAGNOSIS — D485 Neoplasm of uncertain behavior of skin: Secondary | ICD-10-CM | POA: Diagnosis not present

## 2018-02-06 DIAGNOSIS — M1A00X1 Idiopathic chronic gout, unspecified site, with tophus (tophi): Secondary | ICD-10-CM | POA: Diagnosis not present

## 2018-02-06 DIAGNOSIS — D2261 Melanocytic nevi of right upper limb, including shoulder: Secondary | ICD-10-CM | POA: Diagnosis not present

## 2018-02-06 DIAGNOSIS — Z85820 Personal history of malignant melanoma of skin: Secondary | ICD-10-CM | POA: Diagnosis not present

## 2018-02-06 DIAGNOSIS — D225 Melanocytic nevi of trunk: Secondary | ICD-10-CM | POA: Diagnosis not present

## 2018-02-06 DIAGNOSIS — L986 Other infiltrative disorders of the skin and subcutaneous tissue: Secondary | ICD-10-CM | POA: Diagnosis not present

## 2018-02-06 DIAGNOSIS — L821 Other seborrheic keratosis: Secondary | ICD-10-CM | POA: Diagnosis not present

## 2018-02-06 DIAGNOSIS — L570 Actinic keratosis: Secondary | ICD-10-CM | POA: Diagnosis not present

## 2018-02-06 DIAGNOSIS — D2262 Melanocytic nevi of left upper limb, including shoulder: Secondary | ICD-10-CM | POA: Diagnosis not present

## 2018-02-23 DIAGNOSIS — H40013 Open angle with borderline findings, low risk, bilateral: Secondary | ICD-10-CM | POA: Diagnosis not present

## 2018-02-23 DIAGNOSIS — H40053 Ocular hypertension, bilateral: Secondary | ICD-10-CM | POA: Diagnosis not present

## 2018-02-25 DIAGNOSIS — R0602 Shortness of breath: Secondary | ICD-10-CM | POA: Diagnosis not present

## 2018-02-25 DIAGNOSIS — J45909 Unspecified asthma, uncomplicated: Secondary | ICD-10-CM | POA: Diagnosis not present

## 2018-02-25 DIAGNOSIS — F419 Anxiety disorder, unspecified: Secondary | ICD-10-CM | POA: Diagnosis not present

## 2018-03-06 ENCOUNTER — Telehealth: Payer: Self-pay | Admitting: Internal Medicine

## 2018-03-06 NOTE — Telephone Encounter (Signed)
Pt dropped off discharge summary from ER visit in [employer]. Placed on cart.

## 2018-03-07 NOTE — Telephone Encounter (Signed)
Spoke to pt. He said he was having some chest and throat discomfort. He was having issues with breathing and that is why he went to the ER. He is breathing a little better now. Feels nervous and cannot sit down. Thinks it may have been anxiety. He has an appt with Dr Silvio Pate tomorrow.

## 2018-03-08 ENCOUNTER — Encounter: Payer: Self-pay | Admitting: Internal Medicine

## 2018-03-08 ENCOUNTER — Ambulatory Visit (INDEPENDENT_AMBULATORY_CARE_PROVIDER_SITE_OTHER): Payer: Medicare Other | Admitting: Internal Medicine

## 2018-03-08 VITALS — BP 110/80 | HR 71 | Temp 97.4°F | Ht 69.0 in | Wt 181.0 lb

## 2018-03-08 DIAGNOSIS — R0602 Shortness of breath: Secondary | ICD-10-CM | POA: Diagnosis not present

## 2018-03-08 DIAGNOSIS — M1000 Idiopathic gout, unspecified site: Secondary | ICD-10-CM | POA: Diagnosis not present

## 2018-03-08 NOTE — Patient Instructions (Signed)
Please try over the counter guaifenesin 600mg  twice a day to loosen up the mucus.

## 2018-03-08 NOTE — Progress Notes (Signed)
Subjective:    Patient ID: Johnathan Lambert, male    DOB: 07-27-1950, 67 y.o.   MRN: [id number]  HPI Here due to breathing problems  Now retired Down in [place] with wife Had shortness of breath This is similar to previous episode about 18 months ago--that albuterol inhaler helped Prednisone then made him feel very racy Felt anxious and nervous---"just wanted to walk" Had EKG, CXR and blood work--but nothing showed up Did get prescription for albuterol  He did try some left over lorazepam from the episode 18 months ago Has calmed him down Feels fine but still having congestion in his chest Not much cough---tries to cough to loosen mucus (rare clear sputum) Now not SOB---"I can pretty much get my deep breaths" No fever  No chronic anxiety---but similar to past spell  Current Outpatient Medications on File Prior to Visit  Medication Sig Dispense Refill  . aspirin 81 MG tablet Take 81 mg by mouth daily.      Marland Kitchen atorvastatin (LIPITOR) 20 MG tablet Take 1 tablet (20 mg total) by mouth daily. 90 tablet 3  . Colchicine 0.6 MG CAPS Take 1 capsule by mouth 2 (two) times daily as needed. 60 capsule 11  . Multiple Vitamin (MULTIVITAMIN) tablet Take 1 tablet by mouth daily.      . sildenafil (REVATIO) 20 MG tablet TAKE 3 TO 5 TABLETS BY MOUTH ONCE DAILY AS NEEDED 50 tablet 11   No current facility-administered medications on file prior to visit.     No Known Allergies  Past Medical History:  Diagnosis Date  . Broken arm 1990   cast  . FH: colonic polyps   . Gout   . Hyperlipidemia   . Melanoma of back ([place]) 1/16    Past Surgical History:  Procedure Laterality Date  . CATARACT EXTRACTION W/ INTRAOCULAR LENS  IMPLANT, BILATERAL  3/12   both eyes  . COLONOSCOPY    . SHOULDER SURGERY  1985   Right    Family History  Problem Relation Age of Onset  . Hyperlipidemia Mother   . Hyperthyroidism Mother   . Heart disease Father 41       MI and CAD  .  Hyperthyroidism Sister   . Hyperthyroidism Brother   . Cancer Paternal Uncle        lung  . Colon cancer Neg Hx   . Esophageal cancer Neg Hx   . Rectal cancer Neg Hx   . Stomach cancer Neg Hx     Social History   Socioeconomic History  . Marital status: Married    Spouse name: Not on file  . Number of children: 2  . Years of education: Not on file  . Highest education level: Not on file  Occupational History  . 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 Financial resource strain: Not on file  . Food insecurity:    Worry: Not on file    Inability: Not on file  . Transportation needs:    Medical: Not on file    Non-medical: Not on file  Tobacco Use  . Smoking status: Never Smoker  . Smokeless tobacco: Never Used  Substance and Sexual Activity  . Alcohol use: Yes    Comment: Occassionally  . Drug use: No  . Sexual activity: Not on file  Lifestyle  . Physical activity:    Days per week: Not on file    Minutes per session: Not on file  .  Stress: Not on file  Relationships  . Social connections:    Talks on phone: Not on file    Gets together: Not on file    Attends religious service: Not on file    Active member of club or organization: Not on file    Attends meetings of clubs or organizations: Not on file    Relationship status: Not on file  . Intimate partner violence:    Fear of current or ex partner: Not on file    Emotionally abused: Not on file    Physically abused: Not on file    Forced sexual activity: Not on file  Other Topics Concern  . Not on file  Social History Narrative   Walks and enjoys golf.       No living will   Wife should make decisions   Would accept resuscitation   Review of Systems Sleeping okay Appetite is better now---it was off when he got this spell last week Weight stable No depression or sadness No heartburn    Objective:   Physical Exam  Constitutional: He appears well-developed. No  distress.  HENT:  Mouth/Throat: Oropharynx is clear and moist. No oropharyngeal exudate.  Cardiovascular: Normal rate, regular rhythm and normal heart sounds. Exam reveals no gallop.  No murmur heard. Respiratory: Effort normal and breath sounds normal. No respiratory distress. He has no wheezes. He has no rales.  Musculoskeletal: He exhibits no edema.           Assessment & Plan:

## 2018-03-08 NOTE — Assessment & Plan Note (Addendum)
Has feeling of bronchial congestion Some degree of anxiety---but doesn't seem to be a primary issue Has been on timolol --stopped after the spell and is reviewing with his eye doctor. Lorazepam has helped --like 18 months ago---told him to go back to prn only  Can try guaifenesin to see if that helps the bronchial symptoms Will try the other eye drops for now--but not clear the beta blocker caused the problems (but certainly possible) Past work up by pulmonary was reassuring No further testing for now

## 2018-03-08 NOTE — Assessment & Plan Note (Signed)
Didn't tolerate other colchicine but has done fine on the colcrys

## 2018-03-21 ENCOUNTER — Encounter: Payer: Self-pay | Admitting: Internal Medicine

## 2018-03-21 ENCOUNTER — Ambulatory Visit (INDEPENDENT_AMBULATORY_CARE_PROVIDER_SITE_OTHER): Payer: Medicare Other | Admitting: Internal Medicine

## 2018-03-21 DIAGNOSIS — E785 Hyperlipidemia, unspecified: Secondary | ICD-10-CM | POA: Diagnosis not present

## 2018-03-21 DIAGNOSIS — Z0000 Encounter for general adult medical examination without abnormal findings: Secondary | ICD-10-CM | POA: Diagnosis not present

## 2018-03-21 DIAGNOSIS — Z7189 Other specified counseling: Secondary | ICD-10-CM

## 2018-03-21 DIAGNOSIS — M1000 Idiopathic gout, unspecified site: Secondary | ICD-10-CM

## 2018-03-21 DIAGNOSIS — R0602 Shortness of breath: Secondary | ICD-10-CM | POA: Diagnosis not present

## 2018-03-21 DIAGNOSIS — N486 Induration penis plastica: Secondary | ICD-10-CM | POA: Diagnosis not present

## 2018-03-21 MED ORDER — ATORVASTATIN CALCIUM 20 MG PO TABS: 20.0000 mg | ORAL_TABLET | Freq: Every day | ORAL | 3 refills | Status: DC

## 2018-03-21 MED ORDER — SILDENAFIL CITRATE 20 MG PO TABS: ORAL_TABLET | ORAL | 11 refills | Status: DC

## 2018-03-21 MED ORDER — COLCRYS 0.6 MG PO TABS: 0.6000 mg | ORAL_TABLET | Freq: Two times a day (BID) | ORAL | 1 refills | Status: AC | PRN

## 2018-03-21 NOTE — Assessment & Plan Note (Signed)
Has persisted but negative work up 1 year ago No symptoms with rapid walk Will try nasal cortisone spray Pulmonary again if persists

## 2018-03-21 NOTE — Patient Instructions (Signed)
Please try over the counter fluticasone nasal spray---2 sprays each nostril daily. It should hopefully help open you up within 1-2 weeks.

## 2018-03-21 NOTE — Assessment & Plan Note (Signed)
Mild --not interested in statin

## 2018-03-21 NOTE — Assessment & Plan Note (Signed)
I have personally reviewed the Medicare Annual Wellness questionnaire and have noted 1. The patient's medical and social history 2. Their use of alcohol, tobacco or illicit drugs 3. Their current medications and supplements 4. The patient's functional ability including ADL's, fall risks, home safety risks and hearing or visual             impairment. 5. Diet and physical activities 6. Evidence for depression or mood disorders  The patients weight, height, BMI and visual acuity have been recorded in the chart I have made referrals, counseling and provided education to the patient based review of the above and I have provided the pt with a written personalized care plan for preventive services.  I have provided you with a copy of your personalized plan for preventive services. Please take the time to review along with your updated medication list.  Wants to hold off on pneumovax and flu vaccines with the SOB Colon due 2020 Will defer PSA to next year Tries to exercise regularly

## 2018-03-21 NOTE — Progress Notes (Signed)
Hearing Screening   Method: Audiometry   125Hz  250Hz  500Hz  1000Hz  2000Hz  3000Hz  4000Hz  6000Hz  8000Hz   Right ear:   20 20 20  20     Left ear:   20 20 20  20       Visual Acuity Screening   Right eye Left eye Both eyes  Without correction:     With correction: 20/15 20/13 20/13

## 2018-03-21 NOTE — Assessment & Plan Note (Signed)
Just notes this to me Still able to have intercourse Urologist if he wants to look into Rx

## 2018-03-21 NOTE — Progress Notes (Signed)
Subjective:    Patient ID: Johnathan Lambert, male    DOB: 1950/12/03, 67 y.o.   MRN: [id number]  HPI Here for Medicare Wellness visit and follow up of chronic health conditions Reviewed form and advanced directives Reviewed other doctors 3-4 beers a day---like with golf, etc No tobacco Tries to exercise regularly Vision and hearing are fine No falls No depression or anhedonia Independent with instrumental ADLs No sig memory issues  Still has some breathing issues Retired x 3 months [place] regularly Doesn't tend to come on if he is busy Guaifenesin didn't help Remembers spell of dependence on nasal spray at age 89. Finally got off after a year with prednisone wean. Had congestion that is faintly reminiscent of what he has now Doesn't notice problems when walking---enjoys this Hasn't used the albuterol lately  He notes big curve in penis Thinks he has Peyronnie's  Still able to have sex  Will go back on the timolol Didn't seem to be related to the SOB  Tried "generic" colchicine Didn't think it worked Brand colcrys seems to work for him---just uses prn  Mild HLD Not interested in medication  Current Outpatient Medications on File Prior to Visit  Medication Sig Dispense Refill  . aspirin 81 MG tablet Take 81 mg by mouth daily.      Marland Kitchen atorvastatin (LIPITOR) 20 MG tablet Take 1 tablet (20 mg total) by mouth daily. 90 tablet 3  . colchicine (COLCRYS) 0.6 MG tablet Take 0.6 mg by mouth 2 (two) times daily as needed.    . Multiple Vitamin (MULTIVITAMIN) tablet Take 1 tablet by mouth daily.      . sildenafil (REVATIO) 20 MG tablet TAKE 3 TO 5 TABLETS BY MOUTH ONCE DAILY AS NEEDED 50 tablet 11   No current facility-administered medications on file prior to visit.     No Active Allergies  Past Medical History:  Diagnosis Date  . Broken arm 1990   cast  . FH: colonic polyps   . Gout   . Hyperlipidemia   . Melanoma of back ([place]) 1/16    Past  Surgical History:  Procedure Laterality Date  . CATARACT EXTRACTION W/ INTRAOCULAR LENS  IMPLANT, BILATERAL  3/12   both eyes  . COLONOSCOPY    . SHOULDER SURGERY  1985   Right    Family History  Problem Relation Age of Onset  . Hyperlipidemia Mother   . Hyperthyroidism Mother   . Heart disease Father 52       MI and CAD  . Hyperthyroidism Sister   . Hyperthyroidism Brother   . Cancer Paternal Uncle        lung  . Colon cancer Neg Hx   . Esophageal cancer Neg Hx   . Rectal cancer Neg Hx   . Stomach cancer Neg Hx     Social History   Socioeconomic History  . Marital status: Married    Spouse name: Not on file  . Number of children: 2  . Years of education: Not on file  . Highest education level: Not on file  Occupational History  . 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 Financial resource strain: Not on file  . Food insecurity:    Worry: Not on file    Inability: Not on file  . Transportation needs:    Medical: Not on file    Non-medical: Not on file  Tobacco Use  . Smoking  status: Never Smoker  . Smokeless tobacco: Never Used  Substance and Sexual Activity  . Alcohol use: Yes    Comment: Occassionally  . Drug use: No  . Sexual activity: Not on file  Lifestyle  . Physical activity:    Days per week: Not on file    Minutes per session: Not on file  . Stress: Not on file  Relationships  . Social connections:    Talks on phone: Not on file    Gets together: Not on file    Attends religious service: Not on file    Active member of club or organization: Not on file    Attends meetings of clubs or organizations: Not on file    Relationship status: Not on file  . Intimate partner violence:    Fear of current or ex partner: Not on file    Emotionally abused: Not on file    Physically abused: Not on file    Forced sexual activity: Not on file  Other Topics Concern  . Not on file  Social History Narrative   Walks and  enjoys golf.       No living will   Wife should make decisions   Would accept resuscitation   Would accept tube feeds---at least temporarily   Review of Systems Appetite is fine Weight stable Sleeping well Wears seat belt Teeth fine--- keeps up with dentist No recent skin issues---sees the derm No chest pain or palpitations No dizziness or syncope No edema Bowels are fine.no blood No trouble voiding    Objective:   Physical Exam  Constitutional: He is oriented to person, place, and time. He appears well-developed. No distress.  HENT:  Mouth/Throat: Oropharynx is clear and moist. No oropharyngeal exudate.  Moderate pale nasal congestion  Neck: No thyromegaly present.  Cardiovascular: Normal rate, regular rhythm, normal heart sounds and intact distal pulses. Exam reveals no gallop.  No murmur heard. Respiratory: Effort normal and breath sounds normal. No respiratory distress. He has no wheezes. He has no rales.  GI: Soft. There is no tenderness.  Musculoskeletal: He exhibits no edema or tenderness.  Lymphadenopathy:    He has no cervical adenopathy.  Neurological: He is alert and oriented to person, place, and time.  President--- "Daisy Floro, Barack Quay Burow" [phone] [id number] Recall 3/3  Skin: No rash noted. No erythema.  Psychiatric: He has a normal mood and affect. His behavior is normal.           Assessment & Plan:

## 2018-03-21 NOTE — Assessment & Plan Note (Signed)
Uses colcrys prn

## 2018-03-21 NOTE — Assessment & Plan Note (Signed)
See social history Blank forms given 

## 2018-04-03 ENCOUNTER — Telehealth: Payer: Self-pay | Admitting: Internal Medicine

## 2018-04-03 MED ORDER — SERTRALINE HCL 50 MG PO TABS: 50.0000 mg | ORAL_TABLET | Freq: Every day | ORAL | 1 refills | Status: DC

## 2018-04-03 NOTE — Telephone Encounter (Signed)
If he gets rare spells of the feeling of not being able to breathe--and the lorazepam helps, I am willing to refill it for him. It is not something that should be used regularly though. Find out if this is what he is asking for

## 2018-04-03 NOTE — Telephone Encounter (Signed)
Okay to send Rx for sertraline 50mg  daily (#30 x 1) He should not use the lorazepam if he is having daily symptoms Set up follow up in 2-3 weeks

## 2018-04-03 NOTE — Telephone Encounter (Signed)
Spoke to pt. He said he has not been taking the lorazepam. He has been using the albuterol inhaler and the nasal spray. He said it is happening everyday. He is not sure what he needs. He said he has been to a pulmonologist already and his workup was normal. Michela Pitcher he gets nervous when he has to think about breathing. His wife seems to think it is anxiety. He is willing to try something to see if anxiety is the issue.

## 2018-04-03 NOTE — Telephone Encounter (Signed)
Annual exam on 03/21/18; in that office note if problem persist may do pulmonary referral.Please advise.

## 2018-04-03 NOTE — Telephone Encounter (Signed)
Spoke to pt. Made appt for 04-21-18. Rx sent to pharmacy

## 2018-04-03 NOTE — Telephone Encounter (Signed)
Copied from [place] [id number]. Topic: Quick Communication - See Telephone Encounter >> Apr 03, 2018  9:30 AM Blase Mess A wrote: CRM for notification. See Telephone encounter for: 04/03/18.  Patient is calling because he saw Dr. Silvio Pate last week.  Dr. Silvio Pate advised him to take a nasal spray that was troubling his breathing he has been taking it for 2 weeks ago and it is not working. He also would like to see if Dr. Silvio Pate could treat him for anxiety.  He wanted a message sent first.  He said that he would come back in if he needed to after Dr. Alla German advise please advise.  Patient call back [phone]

## 2018-04-21 ENCOUNTER — Ambulatory Visit (INDEPENDENT_AMBULATORY_CARE_PROVIDER_SITE_OTHER): Payer: Medicare Other | Admitting: Internal Medicine

## 2018-04-21 ENCOUNTER — Encounter: Payer: Self-pay | Admitting: Internal Medicine

## 2018-04-21 VITALS — BP 138/82 | HR 49 | Temp 98.1°F | Ht 68.0 in | Wt 187.0 lb

## 2018-04-21 DIAGNOSIS — F39 Unspecified mood [affective] disorder: Secondary | ICD-10-CM | POA: Insufficient documentation

## 2018-04-21 MED ORDER — SERTRALINE HCL 50 MG PO TABS: 50.0000 mg | ORAL_TABLET | Freq: Every day | ORAL | 3 refills | Status: DC

## 2018-04-21 NOTE — Progress Notes (Signed)
Subjective:    Patient ID: Johnathan Lambert, male    DOB: 1951-05-05, 67 y.o.   MRN: [id number]  HPI Here for follow up of anxiety with breathing symptoms  Has tolerated the sertraline Started to notice a difference after about 2 weeks His breathing symptoms are "all good" Like back to normal  Not using lorazepam Hasn't needed albuterol in the past week either  Current Outpatient Medications on File Prior to Visit  Medication Sig Dispense Refill  . aspirin 81 MG tablet Take 81 mg by mouth daily.      Marland Kitchen atorvastatin (LIPITOR) 20 MG tablet Take 1 tablet (20 mg total) by mouth daily. 90 tablet 3  . COLCRYS 0.6 MG tablet Take 1 tablet (0.6 mg total) by mouth 2 (two) times daily as needed. 90 tablet 1  . Multiple Vitamin (MULTIVITAMIN) tablet Take 1 tablet by mouth daily.      . sertraline (ZOLOFT) 50 MG tablet Take 1 tablet (50 mg total) by mouth daily. 30 tablet 1  . sildenafil (REVATIO) 20 MG tablet TAKE 3 TO 5 TABLETS BY MOUTH ONCE DAILY AS NEEDED 50 tablet 11   No current facility-administered medications on file prior to visit.     No Known Allergies  Past Medical History:  Diagnosis Date  . Broken arm 1990   cast  . FH: colonic polyps   . Gout   . Hyperlipidemia   . Melanoma of back ([place]) 1/16    Past Surgical History:  Procedure Laterality Date  . CATARACT EXTRACTION W/ INTRAOCULAR LENS  IMPLANT, BILATERAL  3/12   both eyes  . COLONOSCOPY    . SHOULDER SURGERY  1985   Right    Family History  Problem Relation Age of Onset  . Hyperlipidemia Mother   . Hyperthyroidism Mother   . Heart disease Father 78       MI and CAD  . Hyperthyroidism Sister   . Hyperthyroidism Brother   . Cancer Paternal Uncle        lung  . Colon cancer Neg Hx   . Esophageal cancer Neg Hx   . Rectal cancer Neg Hx   . Stomach cancer Neg Hx     Social History   Socioeconomic History  . Marital status: Married    Spouse name: Not on file  . Number of children: 2  . Years of  education: Not on file  . Highest education level: Not on file  Occupational History  . 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 Financial resource strain: Not on file  . Food insecurity:    Worry: Not on file    Inability: Not on file  . Transportation needs:    Medical: Not on file    Non-medical: Not on file  Tobacco Use  . Smoking status: Never Smoker  . Smokeless tobacco: Never Used  Substance and Sexual Activity  . Alcohol use: Yes    Comment: Occassionally  . Drug use: No  . Sexual activity: Not on file  Lifestyle  . Physical activity:    Days per week: Not on file    Minutes per session: Not on file  . Stress: Not on file  Relationships  . Social connections:    Talks on phone: Not on file    Gets together: Not on file    Attends religious service: Not on file    Active member of club or organization: Not on file  Attends meetings of clubs or organizations: Not on file    Relationship status: Not on file  . Intimate partner violence:    Fear of current or ex partner: Not on file    Emotionally abused: Not on file    Physically abused: Not on file    Forced sexual activity: Not on file  Other Topics Concern  . Not on file  Social History Narrative   Walks and enjoys golf.       No living will   Wife should make decisions   Would accept resuscitation   Would accept tube feeds---at least temporarily   Review of Systems  Appetite is fine Sleeping well     Objective:   Physical Exam  Constitutional: He appears well-developed. No distress.  Psychiatric:  Calm Normal appearance and speech Not depressed or anxious Appropriate affect           Assessment & Plan:

## 2018-04-21 NOTE — Assessment & Plan Note (Signed)
Seems to have somatic (respiratory) symptoms associated with anxiety Tolerating the medication fine Notes strong FH anxiety (mother and sister both on the same medication) Will continue for now Recheck 6 months---can then decide about wean vs continuing

## 2018-06-02 DIAGNOSIS — Z85820 Personal history of malignant melanoma of skin: Secondary | ICD-10-CM | POA: Diagnosis not present

## 2018-06-02 DIAGNOSIS — D485 Neoplasm of uncertain behavior of skin: Secondary | ICD-10-CM | POA: Diagnosis not present

## 2018-06-02 DIAGNOSIS — M1A00X1 Idiopathic chronic gout, unspecified site, with tophus (tophi): Secondary | ICD-10-CM | POA: Diagnosis not present

## 2018-10-11 ENCOUNTER — Telehealth: Payer: Self-pay | Admitting: Internal Medicine

## 2018-10-11 NOTE — Telephone Encounter (Signed)
I let patient know his appointment was cancelled on 10/20/18 and he just needs to keep his appointment in October.

## 2018-10-11 NOTE — Telephone Encounter (Signed)
Patient has a 6 month f/u scheduled on 10/20/18. I'm going to call him to try and change his appointment to [email or web address].  Do you want patient to have lab work done before his appointment?

## 2018-10-11 NOTE — Telephone Encounter (Signed)
It looks like he just needs an AMW in October

## 2018-10-19 DIAGNOSIS — H40021 Open angle with borderline findings, high risk, right eye: Secondary | ICD-10-CM | POA: Diagnosis not present

## 2018-10-19 DIAGNOSIS — H401121 Primary open-angle glaucoma, left eye, mild stage: Secondary | ICD-10-CM | POA: Diagnosis not present

## 2018-10-20 ENCOUNTER — Ambulatory Visit: Payer: PRIVATE HEALTH INSURANCE | Admitting: Internal Medicine

## 2018-11-09 ENCOUNTER — Encounter: Payer: Self-pay | Admitting: Gastroenterology

## 2018-11-30 ENCOUNTER — Ambulatory Visit: Payer: Medicare Other

## 2018-11-30 ENCOUNTER — Other Ambulatory Visit: Payer: Self-pay

## 2018-11-30 VITALS — Ht 69.0 in | Wt 185.0 lb

## 2018-11-30 DIAGNOSIS — Z86010 Personal history of colonic polyps: Secondary | ICD-10-CM

## 2018-11-30 MED ORDER — NA SULFATE-K SULFATE-MG SULF 17.5-3.13-1.6 GM/177ML PO SOLN: 1.0000 | Freq: Once | ORAL | 0 refills | Status: AC

## 2018-11-30 NOTE — Progress Notes (Signed)
No egg or soy allergy known to patient  No issues with past sedation with any surgeries  or procedures, no intubation problems  No diet pills per patient No home 02 use per patient  No blood thinners per patient  Pt denies issues with constipation  No A fib or A flutter  EMMI video sent to pt's e mail , pt declined    

## 2018-12-07 ENCOUNTER — Telehealth: Payer: Self-pay | Admitting: Gastroenterology

## 2018-12-07 MED ORDER — SUPREP BOWEL PREP KIT 17.5-3.13-1.6 GM/177ML PO SOLN: 1.0000 | Freq: Once | ORAL | 0 refills | Status: AC

## 2018-12-07 NOTE — Telephone Encounter (Signed)
Patient needs suprep for his procedure sent to [facility] [place]

## 2018-12-07 NOTE — Telephone Encounter (Signed)
Suprep sent to [place]

## 2018-12-13 ENCOUNTER — Telehealth: Payer: Self-pay | Admitting: Gastroenterology

## 2018-12-13 NOTE — Telephone Encounter (Signed)
All answers are no. °

## 2018-12-13 NOTE — Telephone Encounter (Signed)

## 2018-12-14 ENCOUNTER — Other Ambulatory Visit: Payer: Self-pay

## 2018-12-14 ENCOUNTER — Ambulatory Visit (AMBULATORY_SURGERY_CENTER): Payer: Medicare Other | Admitting: Gastroenterology

## 2018-12-14 ENCOUNTER — Encounter: Payer: Self-pay | Admitting: Gastroenterology

## 2018-12-14 VITALS — BP 131/93 | HR 42 | Temp 97.4°F | Resp 13 | Ht 69.0 in | Wt 185.0 lb

## 2018-12-14 DIAGNOSIS — D122 Benign neoplasm of ascending colon: Secondary | ICD-10-CM | POA: Diagnosis not present

## 2018-12-14 DIAGNOSIS — Z86010 Personal history of colonic polyps: Secondary | ICD-10-CM | POA: Diagnosis not present

## 2018-12-14 DIAGNOSIS — D123 Benign neoplasm of transverse colon: Secondary | ICD-10-CM | POA: Diagnosis not present

## 2018-12-14 DIAGNOSIS — D125 Benign neoplasm of sigmoid colon: Secondary | ICD-10-CM

## 2018-12-14 DIAGNOSIS — Z1211 Encounter for screening for malignant neoplasm of colon: Secondary | ICD-10-CM | POA: Diagnosis not present

## 2018-12-14 MED ORDER — SODIUM CHLORIDE 0.9 % IV SOLN: 500.0000 mL | Freq: Once | INTRAVENOUS | Status: DC

## 2018-12-14 NOTE — Progress Notes (Signed)
Temp taken by June Bullock and VS taken by Rica Mote.  Pt's states no medical or surgical changes since previsit or office visit.

## 2018-12-14 NOTE — Op Note (Signed)
[place] Patient Name: Johnathan Lambert Procedure Date: 12/14/2018 7:34 AM MRN: [id number] Endoscopist: Ladene Artist , MD Age: 68 Referring MD:  Date of Birth: Mar 19, 1951 Gender: Male Account #: [id number] Procedure:                Colonoscopy Indications:              Surveillance: Personal history of adenomatous                            polyps on last colonoscopy 5 years ago Medicines:                Monitored Anesthesia Care Procedure:                Pre-Anesthesia Assessment:                           - Prior to the procedure, a History and Physical                            was performed, and patient medications and                            allergies were reviewed. The patient's tolerance of                            previous anesthesia was also reviewed. The risks                            and benefits of the procedure and the sedation                            options and risks were discussed with the patient.                            All questions were answered, and informed consent                            was obtained. Prior Anticoagulants: The patient has                            taken no previous anticoagulant or antiplatelet                            agents. ASA Grade Assessment: II - A patient with                            mild systemic disease. After reviewing the risks                            and benefits, the patient was deemed in                            satisfactory condition to undergo the procedure.  After obtaining informed consent, the colonoscope                            was passed under direct vision. Throughout the                            procedure, the patient's blood pressure, pulse, and                            oxygen saturations were monitored continuously. The                            Colonoscope was introduced through the anus and                            advanced to the the cecum,  identified by                            appendiceal orifice and ileocecal valve. The                            ileocecal valve, appendiceal orifice, and rectum                            were photographed. The quality of the bowel                            preparation was good. The colonoscopy was performed                            without difficulty. The patient tolerated the                            procedure well. Scope In: 7:41:12 AM Scope Out: 7:54:38 AM Scope Withdrawal Time: 0 hours 12 minutes 20 seconds  Total Procedure Duration: 0 hours 13 minutes 26 seconds  Findings:                 The perianal and digital rectal examinations were                            normal.                           Four sessile polyps were found in the sigmoid colon                            (1), transverse colon (1) and ascending colon (2).                            The polyps were 6 to 8 mm in size. These polyps                            were removed with a cold snare. Resection and  retrieval were complete.                           Internal hemorrhoids were found during                            retroflexion. The hemorrhoids were small and Grade                            I (internal hemorrhoids that do not prolapse).                           The exam was otherwise without abnormality on                            direct and retroflexion views. Complications:            No immediate complications. Estimated blood loss:                            None. Estimated Blood Loss:     Estimated blood loss: none. Impression:               - Four 6 to 8 mm polyps in the sigmoid colon, in                            the transverse colon and in the ascending colon,                            removed with a cold snare. Resected and retrieved.                           - Internal hemorrhoids.                           - The examination was otherwise normal on direct                             and retroflexion views. Recommendation:           - Repeat colonoscopy date to be determined after                            pending pathology results are reviewed for                            surveillance.                           - Patient has a contact number available for                            emergencies. The signs and symptoms of potential                            delayed complications were discussed with the  patient. Return to normal activities tomorrow.                            Written discharge instructions were provided to the                            patient.                           - Resume previous diet.                           - Continue present medications.                           - Await pathology results. Ladene Artist, MD 12/14/2018 7:58:35 AM This report has been signed electronically.

## 2018-12-14 NOTE — Progress Notes (Signed)
Called to room to assist during endoscopic procedure.  Patient ID and intended procedure confirmed with present staff. Received instructions for my participation in the procedure from the performing physician.  

## 2018-12-14 NOTE — Progress Notes (Signed)
A and O x3. Report to RN. Tolerated MAC anesthesia well.

## 2018-12-14 NOTE — Patient Instructions (Signed)
Thank you for allowing [place] to participate in your care today!  Await pathology results by mail, approximately 2 weeks.  Recommendations for next colonoscopy will be made at that time.  Resume previous diet and medications today.  Return to your normal activities tomorrow.     YOU HAD AN ENDOSCOPIC PROCEDURE TODAY AT [facility]:   Refer to the procedure report that was given to you for any specific questions about what was found during the examination.  If the procedure report does not answer your questions, please call your gastroenterologist to clarify.  If you requested that your care partner not be given the details of your procedure findings, then the procedure report has been included in a sealed envelope for you to review at your convenience later.  YOU SHOULD EXPECT: Some feelings of bloating in the abdomen. Passage of more gas than usual.  Walking can help get rid of the air that was put into your GI tract during the procedure and reduce the bloating. If you had a lower endoscopy (such as a colonoscopy or flexible sigmoidoscopy) you may notice spotting of blood in your stool or on the toilet paper. If you underwent a bowel prep for your procedure, you may not have a normal bowel movement for a few days.  Please Note:  You might notice some irritation and congestion in your nose or some drainage.  This is from the oxygen used during your procedure.  There is no need for concern and it should clear up in a day or so.  SYMPTOMS TO REPORT IMMEDIATELY:   Following lower endoscopy (colonoscopy or flexible sigmoidoscopy):  Excessive amounts of blood in the stool  Significant tenderness or worsening of abdominal pains  Swelling of the abdomen that is new, acute  Fever of 100F or higher    For urgent or emergent issues, a gastroenterologist can be reached at any hour by calling [phone].   DIET:  We do recommend a small meal at first, but then you may proceed  to your regular diet.  Drink plenty of fluids but you should avoid alcoholic beverages for 24 hours.  ACTIVITY:  You should plan to take it easy for the rest of today and you should NOT DRIVE or use heavy machinery until tomorrow (because of the sedation medicines used during the test).    FOLLOW UP: Our staff will call the number listed on your records 48-72 hours following your procedure to check on you and address any questions or concerns that you may have regarding the information given to you following your procedure. If we do not reach you, we will leave a message.  We will attempt to reach you two times.  During this call, we will ask if you have developed any symptoms of COVID 19. If you develop any symptoms (ie: fever, flu-like symptoms, shortness of breath, cough etc.) before then, please call [phone].  If you test positive for Covid 19 in the 2 weeks post procedure, please call and report this information to [place].    If any biopsies were taken you will be contacted by phone or by letter within the next 1-3 weeks.  Please call us at [phone] if you have not heard about the biopsies in 3 weeks.    SIGNATURES/CONFIDENTIALITY: You and/or your care partner have signed paperwork which will be entered into your electronic medical record.  These signatures attest to the fact that that the information above on your After Visit  Summary has been reviewed and is understood.  Full responsibility of the confidentiality of this discharge information lies with you and/or your care-partner. 

## 2018-12-18 ENCOUNTER — Telehealth: Payer: Self-pay | Admitting: *Deleted

## 2018-12-18 NOTE — Telephone Encounter (Signed)
  Follow up Call-  Call back number 12/14/2018  Post procedure Call Back phone  # [phone]  Permission to leave phone message Yes  Some recent data might be hidden     Patient questions:  Do you have a fever, pain , or abdominal swelling? No. Pain Score  0 *  Have you tolerated food without any problems? Yes.    Have you been able to return to your normal activities? Yes.    Do you have any questions about your discharge instructions: Diet   No. Medications  No. Follow up visit  No.  Do you have questions or concerns about your Care? No.  Actions: * If pain score is 4 or above: No action needed, pain <4.  1. Have you developed a fever since your procedure? no  2.   Have you had an respiratory symptoms (SOB or cough) since your procedure? no  3.   Have you tested positive for COVID 19 since your procedure no  4.   Have you had any family members/close contacts diagnosed with the COVID 19 since your procedure?  no   If yes to any of these questions please route to Joylene John, RN and Alphonsa Gin, Therapist, sports.

## 2018-12-27 ENCOUNTER — Encounter: Payer: Self-pay | Admitting: Gastroenterology

## 2019-01-17 DIAGNOSIS — C4359 Malignant melanoma of other part of trunk: Secondary | ICD-10-CM | POA: Diagnosis not present

## 2019-01-17 DIAGNOSIS — D225 Melanocytic nevi of trunk: Secondary | ICD-10-CM | POA: Diagnosis not present

## 2019-01-17 DIAGNOSIS — L814 Other melanin hyperpigmentation: Secondary | ICD-10-CM | POA: Diagnosis not present

## 2019-01-17 DIAGNOSIS — Z85820 Personal history of malignant melanoma of skin: Secondary | ICD-10-CM | POA: Diagnosis not present

## 2019-01-17 DIAGNOSIS — D2261 Melanocytic nevi of right upper limb, including shoulder: Secondary | ICD-10-CM | POA: Diagnosis not present

## 2019-01-17 DIAGNOSIS — L821 Other seborrheic keratosis: Secondary | ICD-10-CM | POA: Diagnosis not present

## 2019-01-17 DIAGNOSIS — D2262 Melanocytic nevi of left upper limb, including shoulder: Secondary | ICD-10-CM | POA: Diagnosis not present

## 2019-01-17 DIAGNOSIS — L570 Actinic keratosis: Secondary | ICD-10-CM | POA: Diagnosis not present

## 2019-01-23 DIAGNOSIS — C4359 Malignant melanoma of other part of trunk: Secondary | ICD-10-CM | POA: Diagnosis not present

## 2019-01-23 DIAGNOSIS — D045 Carcinoma in situ of skin of trunk: Secondary | ICD-10-CM | POA: Diagnosis not present

## 2019-01-23 DIAGNOSIS — Z85820 Personal history of malignant melanoma of skin: Secondary | ICD-10-CM | POA: Diagnosis not present

## 2019-02-22 DIAGNOSIS — H52222 Regular astigmatism, left eye: Secondary | ICD-10-CM | POA: Diagnosis not present

## 2019-02-22 DIAGNOSIS — H524 Presbyopia: Secondary | ICD-10-CM | POA: Diagnosis not present

## 2019-02-22 DIAGNOSIS — H5202 Hypermetropia, left eye: Secondary | ICD-10-CM | POA: Diagnosis not present

## 2019-02-22 DIAGNOSIS — H40023 Open angle with borderline findings, high risk, bilateral: Secondary | ICD-10-CM | POA: Diagnosis not present

## 2019-02-22 DIAGNOSIS — Z9849 Cataract extraction status, unspecified eye: Secondary | ICD-10-CM | POA: Diagnosis not present

## 2019-03-23 ENCOUNTER — Ambulatory Visit (INDEPENDENT_AMBULATORY_CARE_PROVIDER_SITE_OTHER): Payer: Medicare Other | Admitting: Internal Medicine

## 2019-03-23 ENCOUNTER — Encounter: Payer: Self-pay | Admitting: Internal Medicine

## 2019-03-23 ENCOUNTER — Other Ambulatory Visit: Payer: Self-pay

## 2019-03-23 VITALS — BP 136/70 | HR 55 | Temp 98.1°F | Ht 68.0 in | Wt 186.0 lb

## 2019-03-23 DIAGNOSIS — J309 Allergic rhinitis, unspecified: Secondary | ICD-10-CM | POA: Insufficient documentation

## 2019-03-23 DIAGNOSIS — J301 Allergic rhinitis due to pollen: Secondary | ICD-10-CM

## 2019-03-23 DIAGNOSIS — F39 Unspecified mood [affective] disorder: Secondary | ICD-10-CM | POA: Diagnosis not present

## 2019-03-23 DIAGNOSIS — Z0000 Encounter for general adult medical examination without abnormal findings: Secondary | ICD-10-CM

## 2019-03-23 DIAGNOSIS — M1000 Idiopathic gout, unspecified site: Secondary | ICD-10-CM

## 2019-03-23 DIAGNOSIS — E785 Hyperlipidemia, unspecified: Secondary | ICD-10-CM | POA: Diagnosis not present

## 2019-03-23 DIAGNOSIS — R0602 Shortness of breath: Secondary | ICD-10-CM | POA: Diagnosis not present

## 2019-03-23 DIAGNOSIS — Z125 Encounter for screening for malignant neoplasm of prostate: Secondary | ICD-10-CM | POA: Diagnosis not present

## 2019-03-23 DIAGNOSIS — Z7189 Other specified counseling: Secondary | ICD-10-CM | POA: Diagnosis not present

## 2019-03-23 LAB — COMPREHENSIVE METABOLIC PANEL
ALT: 18 U/L (ref 0–53)
AST: 20 U/L (ref 0–37)
Albumin: 4.6 g/dL (ref 3.5–5.2)
Alkaline Phosphatase: 69 U/L (ref 39–117)
BUN: 13 mg/dL (ref 6–23)
CO2: 29 mEq/L (ref 19–32)
Calcium: 9.8 mg/dL (ref 8.4–10.5)
Chloride: 102 mEq/L (ref 96–112)
Creatinine, Ser: 0.68 mg/dL (ref 0.40–1.50)
GFR: 115.95 mL/min (ref >60.00)
Glucose, Bld: 104 mg/dL — ABNORMAL HIGH (ref 70–99)
Potassium: 4.2 mEq/L (ref 3.5–5.1)
Sodium: 140 mEq/L (ref 135–145)
Total Bilirubin: 0.9 mg/dL (ref 0.2–1.2)
Total Protein: 7.3 g/dL (ref 6.0–8.3)

## 2019-03-23 LAB — LIPID PANEL
Cholesterol: 232 mg/dL — ABNORMAL HIGH (ref 0–200)
HDL: 62.8 mg/dL (ref >39.00)
NonHDL: 169.37
Total CHOL/HDL Ratio: 4
Triglycerides: 326 mg/dL — ABNORMAL HIGH (ref 0.0–149.0)
VLDL: 65.2 mg/dL — ABNORMAL HIGH (ref 0.0–40.0)

## 2019-03-23 LAB — CBC
HCT: 42.3 % (ref 39.0–52.0)
Hemoglobin: 14 g/dL (ref 13.0–17.0)
MCHC: 33.2 g/dL (ref 30.0–36.0)
MCV: 97 fl (ref 78.0–100.0)
Platelets: 221 10*3/uL (ref 150.0–400.0)
RBC: 4.36 Mil/uL (ref 4.22–5.81)
RDW: 14 % (ref 11.5–15.5)
WBC: 7.5 10*3/uL (ref 4.0–10.5)

## 2019-03-23 LAB — LDL CHOLESTEROL, DIRECT: Direct LDL: 124 mg/dL

## 2019-03-23 LAB — PSA, MEDICARE: PSA: 0.39 ng/ml (ref 0.10–4.00)

## 2019-03-23 NOTE — Assessment & Plan Note (Signed)
I have personally reviewed the Medicare Annual Wellness questionnaire and have noted 1. The patient's medical and social history 2. Their use of alcohol, tobacco or illicit drugs 3. Their current medications and supplements 4. The patient's functional ability including ADL's, fall risks, home safety risks and hearing or visual             impairment. 5. Diet and physical activities 6. Evidence for depression or mood disorders  The patients weight, height, BMI and visual acuity have been recorded in the chart I have made referrals, counseling and provided education to the patient based review of the above and I have provided the pt with a written personalized care plan for preventive services.  I have provided you with a copy of your personalized plan for preventive services. Please take the time to review along with your updated medication list.  Will check PSA after discussion Colon due 2023 Discussed fitness No flu or pneumovax since reaction to other

## 2019-03-23 NOTE — Progress Notes (Signed)
Hearing Screening   Method: Audiometry   125Hz 250Hz 500Hz 1000Hz 2000Hz 3000Hz 4000Hz 6000Hz 8000Hz  Right ear:   20 20 20  20    Left ear:   20 20 20  20    Vision Screening Comments: September 2020   

## 2019-03-23 NOTE — Assessment & Plan Note (Signed)
Has been quiet with the sertraline Will continue

## 2019-03-23 NOTE — Progress Notes (Signed)
Subjective:    Patient ID: Johnathan Lambert, male    DOB: Mar 05, 1951, 68 y.o.   MRN: [id number]  HPI Here for Medicare wellness visit and follow up of chronic health conditions Reviewed form and advanced directives Reviewed other doctors Regular beer ---often will have 3-6 beers a day. Discussed --never drives after, etc No tobacco Some exercise but not much No falls Vision is okay--- regular checks due to glaucoma Hearing seems fine Independent with instrumental ADLs No sig memory problems  Has pain in PIP joints left 2nd and 3rd Stiff in AM --takes a while to loosen up Affects his golf game Hasn't tried anything for it  Has large knot proximal to left 1st MTP Larger now No active gout---only used the colchicine once this year Tight in work boots---otherwise no pain  Wife notes he is clearing his throat more Stopped steroid nasal spray (glaucoma)---now more noticeable Hasn't tried antihistamines  Still gets [place] like burning in chest with higher heart rate---like with jogging Doesn't seem to be easier No true palpitations No dizziness or syncope No edema  Anxiety seems controlled on medication No depression or anxiety Spending time at the beach  Still has some right hip pain Seems worse when just standing Has had ortho evaluation---not clearly all the hip Did one dry needling---seemed to help  Still with Peyronnie's Looking into OTC Rx  Current Outpatient Medications on File Prior to Visit  Medication Sig Dispense Refill   aspirin 81 MG tablet Take 81 mg by mouth daily.       atorvastatin (LIPITOR) 20 MG tablet Take 1 tablet (20 mg total) by mouth daily. 90 tablet 3   COLCRYS 0.6 MG tablet Take 1 tablet (0.6 mg total) by mouth 2 (two) times daily as needed. 90 tablet 1   dorzolamide-timolol (COSOPT) 22.3-6.8 MG/ML ophthalmic solution Place 1 drop into both eyes 2 (two) times daily.     Multiple Vitamin (MULTIVITAMIN) tablet Take 1 tablet by mouth  daily.       sertraline (ZOLOFT) 50 MG tablet Take 1 tablet (50 mg total) by mouth daily. 90 tablet 3   sildenafil (REVATIO) 20 MG tablet TAKE 3 TO 5 TABLETS BY MOUTH ONCE DAILY AS NEEDED 50 tablet 11   No current facility-administered medications on file prior to visit.     No Known Allergies  Past Medical History:  Diagnosis Date   Broken arm 1990   cast   Cataract    FH: colonic polyps    Glaucoma    Gout    Hyperlipidemia    Melanoma of back ([place]) 1/16    Past Surgical History:  Procedure Laterality Date   CATARACT EXTRACTION W/ INTRAOCULAR LENS  IMPLANT, BILATERAL  3/12   both eyes   COLONOSCOPY     SHOULDER SURGERY  1985   Right    Family History  Problem Relation Age of Onset   Hyperlipidemia Mother    Hyperthyroidism Mother    Heart disease Father 28       MI and CAD   Hyperthyroidism Sister    Hyperthyroidism Brother    Cancer Paternal Uncle        lung   Colon cancer Neg Hx    Esophageal cancer Neg Hx    Rectal cancer Neg Hx    Stomach cancer Neg Hx     Social History   Socioeconomic History   Marital status: Married    Spouse name: Not on file   Number  of children: 2   Years of education: Not on file   Highest education level: Not on file  Occupational History   Occupation: Science writer    Comment: [employer]--retired  Scientist, product/process development strain: Not on file   Food insecurity    Worry: Not on file    Inability: Not on file   Transportation needs    Medical: Not on file    Non-medical: Not on file  Tobacco Use   Smoking status: Never Smoker   Smokeless tobacco: Never Used  Substance and Sexual Activity   Alcohol use: Yes    Comment: Occassionally   Drug use: No   Sexual activity: Not on file  Lifestyle   Physical activity    Days per week: Not on file    Minutes per session: Not on file   Stress: Not on file  Relationships   Social connections    Talks on phone: Not on file     Gets together: Not on file    Attends religious service: Not on file    Active member of club or organization: Not on file    Attends meetings of clubs or organizations: Not on file    Relationship status: Not on file   Intimate partner violence    Fear of current or ex partner: Not on file    Emotionally abused: Not on file    Physically abused: Not on file    Forced sexual activity: Not on file  Other Topics Concern   Not on file  Social History Narrative   Walks and enjoys golf.       No living will   Wife should make decisions   Would accept resuscitation   Would accept tube feeds---at least temporarily   Review of Systems Appetite is good Weight is stable Sleeps well now since retiring Wears seat belt Teeth are fine--- keeps up with dentist No current suspicious lesions--due for derm check up (had excision from back) No heartburn or dysphagia Bowels are fine--no blood Voids fine. Good flow. Only occasional nocturia    Objective:   Physical Exam  Constitutional: He is oriented to person, place, and time. He appears well-developed. No distress.  HENT:  Mouth/Throat: Oropharynx is clear and moist. No oropharyngeal exudate.  Neck: No thyromegaly present.  Cardiovascular: Normal rate, regular rhythm, normal heart sounds and intact distal pulses. Exam reveals no gallop.  No murmur heard. Respiratory: Effort normal and breath sounds normal. No respiratory distress. He has no wheezes. He has no rales.  GI: Soft. There is no abdominal tenderness.  Musculoskeletal:        General: No tenderness or edema.  Lymphadenopathy:    He has no cervical adenopathy.  Neurological: He is alert and oriented to person, place, and time.  President---"Donald Trump, Jennye Boroughs" [id number] Recall 3/3  Skin: No rash noted. No erythema.  Psychiatric: He has a normal mood and affect. His behavior is normal.           Assessment & Plan:

## 2019-03-23 NOTE — Assessment & Plan Note (Signed)
No problems with atorvastatin 

## 2019-03-23 NOTE — Patient Instructions (Addendum)
I would recommend trying something like IcyHot or aspercreme on your painful fingers. If that is not helpful, you can try over the counter diclofenac gel. You can try loratadine 10-20mg  or cetirizine 10mg  daily for the drainage.

## 2019-03-23 NOTE — Assessment & Plan Note (Signed)
More drainage off flonase Will try oral

## 2019-03-23 NOTE — Assessment & Plan Note (Signed)
Sounds like a fitness issue He will work on increasing stamina--consider cardiology if not able to improve

## 2019-03-23 NOTE — Assessment & Plan Note (Signed)
See social history 

## 2019-03-23 NOTE — Assessment & Plan Note (Signed)
Quiet lately Has prominent left 1st MTP but doesn't seem to be related to uric acid

## 2019-04-19 ENCOUNTER — Other Ambulatory Visit: Payer: Self-pay | Admitting: Internal Medicine

## 2019-04-26 DIAGNOSIS — D485 Neoplasm of uncertain behavior of skin: Secondary | ICD-10-CM | POA: Diagnosis not present

## 2019-04-26 DIAGNOSIS — L821 Other seborrheic keratosis: Secondary | ICD-10-CM | POA: Diagnosis not present

## 2019-04-26 DIAGNOSIS — D225 Melanocytic nevi of trunk: Secondary | ICD-10-CM | POA: Diagnosis not present

## 2019-04-26 DIAGNOSIS — Z85820 Personal history of malignant melanoma of skin: Secondary | ICD-10-CM | POA: Diagnosis not present

## 2019-04-26 DIAGNOSIS — L570 Actinic keratosis: Secondary | ICD-10-CM | POA: Diagnosis not present

## 2019-04-26 DIAGNOSIS — L814 Other melanin hyperpigmentation: Secondary | ICD-10-CM | POA: Diagnosis not present

## 2019-05-31 ENCOUNTER — Other Ambulatory Visit: Payer: Self-pay | Admitting: Internal Medicine

## 2019-06-11 DIAGNOSIS — Z20828 Contact with and (suspected) exposure to other viral communicable diseases: Secondary | ICD-10-CM | POA: Diagnosis not present

## 2019-07-09 ENCOUNTER — Other Ambulatory Visit: Payer: Self-pay | Admitting: Internal Medicine

## 2019-08-22 DIAGNOSIS — D234 Other benign neoplasm of skin of scalp and neck: Secondary | ICD-10-CM | POA: Diagnosis not present

## 2019-08-22 DIAGNOSIS — D225 Melanocytic nevi of trunk: Secondary | ICD-10-CM | POA: Diagnosis not present

## 2019-08-22 DIAGNOSIS — D2262 Melanocytic nevi of left upper limb, including shoulder: Secondary | ICD-10-CM | POA: Diagnosis not present

## 2019-08-22 DIAGNOSIS — H5202 Hypermetropia, left eye: Secondary | ICD-10-CM | POA: Diagnosis not present

## 2019-08-22 DIAGNOSIS — H401131 Primary open-angle glaucoma, bilateral, mild stage: Secondary | ICD-10-CM | POA: Diagnosis not present

## 2019-08-22 DIAGNOSIS — H524 Presbyopia: Secondary | ICD-10-CM | POA: Diagnosis not present

## 2019-08-22 DIAGNOSIS — D485 Neoplasm of uncertain behavior of skin: Secondary | ICD-10-CM | POA: Diagnosis not present

## 2019-08-22 DIAGNOSIS — Z85820 Personal history of malignant melanoma of skin: Secondary | ICD-10-CM | POA: Diagnosis not present

## 2019-08-22 DIAGNOSIS — L821 Other seborrheic keratosis: Secondary | ICD-10-CM | POA: Diagnosis not present

## 2019-08-22 DIAGNOSIS — H52222 Regular astigmatism, left eye: Secondary | ICD-10-CM | POA: Diagnosis not present

## 2019-08-22 DIAGNOSIS — L814 Other melanin hyperpigmentation: Secondary | ICD-10-CM | POA: Diagnosis not present

## 2019-08-28 DIAGNOSIS — R0602 Shortness of breath: Secondary | ICD-10-CM

## 2019-08-29 NOTE — Telephone Encounter (Signed)
Charmaine,  Can you cancel the referral to [facility] cardiology in ----his address said [place] but now he states he lives in [place]. See if we can get him in with the physician he has listed

## 2019-08-29 NOTE — Telephone Encounter (Signed)
Referral and notes sent to Dr. Thurston Hole

## 2019-09-13 HISTORY — PX: CORONARY ARTERY BYPASS GRAFT: SHX141

## 2019-09-25 DIAGNOSIS — R0602 Shortness of breath: Secondary | ICD-10-CM | POA: Diagnosis not present

## 2019-09-25 DIAGNOSIS — I200 Unstable angina: Secondary | ICD-10-CM | POA: Diagnosis not present

## 2019-09-25 DIAGNOSIS — Z8249 Family history of ischemic heart disease and other diseases of the circulatory system: Secondary | ICD-10-CM | POA: Insufficient documentation

## 2019-09-25 DIAGNOSIS — E782 Mixed hyperlipidemia: Secondary | ICD-10-CM | POA: Diagnosis not present

## 2019-09-25 DIAGNOSIS — I209 Angina pectoris, unspecified: Secondary | ICD-10-CM | POA: Insufficient documentation

## 2019-09-26 DIAGNOSIS — Z01818 Encounter for other preprocedural examination: Secondary | ICD-10-CM | POA: Diagnosis not present

## 2019-09-28 DIAGNOSIS — R9431 Abnormal electrocardiogram [ECG] [EKG]: Secondary | ICD-10-CM | POA: Diagnosis not present

## 2019-09-28 DIAGNOSIS — Z01811 Encounter for preprocedural respiratory examination: Secondary | ICD-10-CM | POA: Diagnosis not present

## 2019-09-28 DIAGNOSIS — Z9911 Dependence on respirator [ventilator] status: Secondary | ICD-10-CM | POA: Diagnosis not present

## 2019-09-28 DIAGNOSIS — D649 Anemia, unspecified: Secondary | ICD-10-CM | POA: Diagnosis not present

## 2019-09-28 DIAGNOSIS — I371 Nonrheumatic pulmonary valve insufficiency: Secondary | ICD-10-CM | POA: Diagnosis not present

## 2019-09-28 DIAGNOSIS — I209 Angina pectoris, unspecified: Secondary | ICD-10-CM | POA: Diagnosis not present

## 2019-09-28 DIAGNOSIS — J9589 Other postprocedural complications and disorders of respiratory system, not elsewhere classified: Secondary | ICD-10-CM | POA: Diagnosis not present

## 2019-09-28 DIAGNOSIS — R0602 Shortness of breath: Secondary | ICD-10-CM | POA: Diagnosis not present

## 2019-09-28 DIAGNOSIS — I25110 Atherosclerotic heart disease of native coronary artery with unstable angina pectoris: Secondary | ICD-10-CM | POA: Diagnosis not present

## 2019-09-28 DIAGNOSIS — J90 Pleural effusion, not elsewhere classified: Secondary | ICD-10-CM | POA: Diagnosis not present

## 2019-09-28 DIAGNOSIS — E785 Hyperlipidemia, unspecified: Secondary | ICD-10-CM | POA: Diagnosis not present

## 2019-09-28 DIAGNOSIS — Z8249 Family history of ischemic heart disease and other diseases of the circulatory system: Secondary | ICD-10-CM | POA: Diagnosis not present

## 2019-09-28 DIAGNOSIS — I77819 Aortic ectasia, unspecified site: Secondary | ICD-10-CM | POA: Diagnosis present

## 2019-09-28 DIAGNOSIS — I351 Nonrheumatic aortic (valve) insufficiency: Secondary | ICD-10-CM | POA: Diagnosis not present

## 2019-09-28 DIAGNOSIS — Z4682 Encounter for fitting and adjustment of non-vascular catheter: Secondary | ICD-10-CM | POA: Diagnosis not present

## 2019-09-28 DIAGNOSIS — Z01810 Encounter for preprocedural cardiovascular examination: Secondary | ICD-10-CM | POA: Diagnosis not present

## 2019-09-28 DIAGNOSIS — J9811 Atelectasis: Secondary | ICD-10-CM | POA: Diagnosis not present

## 2019-09-28 DIAGNOSIS — Z01818 Encounter for other preprocedural examination: Secondary | ICD-10-CM | POA: Diagnosis not present

## 2019-09-28 DIAGNOSIS — J984 Other disorders of lung: Secondary | ICD-10-CM | POA: Diagnosis not present

## 2019-09-28 DIAGNOSIS — I361 Nonrheumatic tricuspid (valve) insufficiency: Secondary | ICD-10-CM | POA: Diagnosis not present

## 2019-09-28 DIAGNOSIS — M109 Gout, unspecified: Secondary | ICD-10-CM | POA: Diagnosis present

## 2019-09-28 DIAGNOSIS — E782 Mixed hyperlipidemia: Secondary | ICD-10-CM | POA: Diagnosis not present

## 2019-09-28 DIAGNOSIS — Z20822 Contact with and (suspected) exposure to covid-19: Secondary | ICD-10-CM | POA: Diagnosis present

## 2019-09-28 DIAGNOSIS — Z7982 Long term (current) use of aspirin: Secondary | ICD-10-CM | POA: Diagnosis not present

## 2019-09-28 DIAGNOSIS — Z9049 Acquired absence of other specified parts of digestive tract: Secondary | ICD-10-CM | POA: Diagnosis not present

## 2019-09-28 DIAGNOSIS — I25118 Atherosclerotic heart disease of native coronary artery with other forms of angina pectoris: Secondary | ICD-10-CM | POA: Diagnosis not present

## 2019-09-28 DIAGNOSIS — Z9889 Other specified postprocedural states: Secondary | ICD-10-CM | POA: Diagnosis not present

## 2019-09-28 DIAGNOSIS — I2510 Atherosclerotic heart disease of native coronary artery without angina pectoris: Secondary | ICD-10-CM | POA: Diagnosis not present

## 2019-09-28 DIAGNOSIS — Z452 Encounter for adjustment and management of vascular access device: Secondary | ICD-10-CM | POA: Diagnosis not present

## 2019-09-28 DIAGNOSIS — I10 Essential (primary) hypertension: Secondary | ICD-10-CM | POA: Diagnosis not present

## 2019-10-06 MED ORDER — METOPROLOL TARTRATE 25 MG PO TABS
12.50 | ORAL_TABLET | ORAL | Status: DC
Start: 2019-10-06 — End: 2019-10-06

## 2019-10-06 MED ORDER — ALBUMIN HUMAN 5 % IV SOLN
12.50 | INTRAVENOUS | Status: DC
Start: ? — End: 2019-10-06

## 2019-10-06 MED ORDER — GENERIC EXTERNAL MEDICATION
1.00 | Status: DC
Start: ? — End: 2019-10-06

## 2019-10-06 MED ORDER — ALBUMIN HUMAN 5 % IV SOLN
250.00 | INTRAVENOUS | Status: DC
Start: ? — End: 2019-10-06

## 2019-10-06 MED ORDER — DEXTROSE 50 % IV SOLN
25.00 | INTRAVENOUS | Status: DC
Start: ? — End: 2019-10-06

## 2019-10-06 MED ORDER — POLYETHYLENE GLYCOL 3350 17 GM/SCOOP PO POWD
17.00 | ORAL | Status: DC
Start: 2019-10-06 — End: 2019-10-06

## 2019-10-06 MED ORDER — ASPIRIN 325 MG PO TABS
325.00 | ORAL_TABLET | ORAL | Status: DC
Start: 2019-10-07 — End: 2019-10-06

## 2019-10-06 MED ORDER — LATANOPROST 0.005 % OP SOLN
1.00 | OPHTHALMIC | Status: DC
Start: 2019-10-06 — End: 2019-10-06

## 2019-10-06 MED ORDER — GLUCOSE 40 % PO GEL
ORAL | Status: DC
Start: ? — End: 2019-10-06

## 2019-10-06 MED ORDER — MAGNESIUM HYDROXIDE 400 MG/5ML PO SUSP
30.00 | ORAL | Status: DC
Start: ? — End: 2019-10-06

## 2019-10-06 MED ORDER — GENERIC EXTERNAL MEDICATION
0.00 | Status: DC
Start: ? — End: 2019-10-06

## 2019-10-06 MED ORDER — FUROSEMIDE 20 MG PO TABS
20.00 | ORAL_TABLET | ORAL | Status: DC
Start: 2019-10-06 — End: 2019-10-06

## 2019-10-06 MED ORDER — ALUM & MAG HYDROXIDE-SIMETH 200-200-20 MG/5ML PO SUSP
30.00 | ORAL | Status: DC
Start: ? — End: 2019-10-06

## 2019-10-06 MED ORDER — LACTATED RINGERS IV SOLN
250.00 | INTRAVENOUS | Status: DC
Start: ? — End: 2019-10-06

## 2019-10-06 MED ORDER — HYDROMORPHONE HCL 2 MG PO TABS
2.00 | ORAL_TABLET | ORAL | Status: DC
Start: ? — End: 2019-10-06

## 2019-10-06 MED ORDER — SERTRALINE HCL 50 MG PO TABS
50.00 | ORAL_TABLET | ORAL | Status: DC
Start: 2019-10-07 — End: 2019-10-06

## 2019-10-06 MED ORDER — GENERIC EXTERNAL MEDICATION
Status: DC
Start: ? — End: 2019-10-06

## 2019-10-06 MED ORDER — MORPHINE SULFATE 2 MG/ML IJ SOLN
2.00 | INTRAMUSCULAR | Status: DC
Start: ? — End: 2019-10-06

## 2019-10-06 MED ORDER — ATORVASTATIN CALCIUM 20 MG PO TABS
40.00 | ORAL_TABLET | ORAL | Status: DC
Start: 2019-10-07 — End: 2019-10-06

## 2019-10-06 MED ORDER — HYDROMORPHONE HCL 4 MG PO TABS
4.00 | ORAL_TABLET | ORAL | Status: DC
Start: ? — End: 2019-10-06

## 2019-10-06 MED ORDER — MUPIROCIN 2 % EX OINT
1.00 | TOPICAL_OINTMENT | CUTANEOUS | Status: DC
Start: 2019-10-06 — End: 2019-10-06

## 2019-10-06 MED ORDER — DIPHENHYDRAMINE HCL 25 MG PO CAPS
25.00 | ORAL_CAPSULE | ORAL | Status: DC
Start: ? — End: 2019-10-06

## 2019-10-06 MED ORDER — DEXTROMETHORPHAN-GUAIFENESIN 10-100 MG/5ML PO LIQD
10.00 | ORAL | Status: DC
Start: ? — End: 2019-10-06

## 2019-10-06 MED ORDER — DILTIAZEM HCL 125 MG/25ML IV SOLN
0.25 | INTRAVENOUS | Status: DC
Start: ? — End: 2019-10-06

## 2019-10-06 MED ORDER — MENTHOL 9.1 MG MT LOZG
1.00 | LOZENGE | OROMUCOSAL | Status: DC
Start: ? — End: 2019-10-06

## 2019-10-06 MED ORDER — POTASSIUM CHLORIDE CRYS ER 10 MEQ PO TBCR
10.00 | EXTENDED_RELEASE_TABLET | ORAL | Status: DC
Start: 2019-10-06 — End: 2019-10-06

## 2019-10-06 MED ORDER — METOCLOPRAMIDE HCL 5 MG/ML IJ SOLN
10.00 | INTRAMUSCULAR | Status: DC
Start: ? — End: 2019-10-06

## 2019-10-06 MED ORDER — ACETAMINOPHEN 500 MG PO TABS
1000.00 | ORAL_TABLET | ORAL | Status: DC
Start: 2019-10-06 — End: 2019-10-06

## 2019-10-06 MED ORDER — INSULIN LISPRO 100 UNIT/ML ~~LOC~~ SOLN
0.00 | SUBCUTANEOUS | Status: DC
Start: 2019-10-07 — End: 2019-10-06

## 2019-10-06 MED ORDER — DEXTROSE 50 % IV SOLN
50.00 | INTRAVENOUS | Status: DC
Start: ? — End: 2019-10-06

## 2019-10-06 MED ORDER — BISACODYL 10 MG RE SUPP
10.00 | RECTAL | Status: DC
Start: ? — End: 2019-10-06

## 2019-10-06 MED ORDER — INSULIN LISPRO 100 UNIT/ML ~~LOC~~ SOLN
0.00 | SUBCUTANEOUS | Status: DC
Start: 2019-10-06 — End: 2019-10-06

## 2019-10-06 MED ORDER — GLUCAGON (RDNA) 1 MG IJ KIT
1.00 | PACK | INTRAMUSCULAR | Status: DC
Start: ? — End: 2019-10-06

## 2019-10-17 DIAGNOSIS — Z48812 Encounter for surgical aftercare following surgery on the circulatory system: Secondary | ICD-10-CM | POA: Diagnosis not present

## 2019-10-17 DIAGNOSIS — J90 Pleural effusion, not elsewhere classified: Secondary | ICD-10-CM | POA: Diagnosis not present

## 2019-10-17 DIAGNOSIS — Z951 Presence of aortocoronary bypass graft: Secondary | ICD-10-CM | POA: Diagnosis not present

## 2019-11-06 DIAGNOSIS — I25810 Atherosclerosis of coronary artery bypass graft(s) without angina pectoris: Secondary | ICD-10-CM | POA: Diagnosis not present

## 2019-11-06 DIAGNOSIS — Z951 Presence of aortocoronary bypass graft: Secondary | ICD-10-CM | POA: Diagnosis not present

## 2019-11-06 DIAGNOSIS — E782 Mixed hyperlipidemia: Secondary | ICD-10-CM | POA: Diagnosis not present

## 2019-12-03 ENCOUNTER — Encounter: Payer: Self-pay | Admitting: Internal Medicine

## 2019-12-13 DIAGNOSIS — I25810 Atherosclerosis of coronary artery bypass graft(s) without angina pectoris: Secondary | ICD-10-CM | POA: Diagnosis not present

## 2019-12-13 DIAGNOSIS — E782 Mixed hyperlipidemia: Secondary | ICD-10-CM | POA: Diagnosis not present

## 2019-12-13 DIAGNOSIS — Z951 Presence of aortocoronary bypass graft: Secondary | ICD-10-CM | POA: Diagnosis not present

## 2019-12-18 DIAGNOSIS — Z951 Presence of aortocoronary bypass graft: Secondary | ICD-10-CM | POA: Diagnosis not present

## 2019-12-18 DIAGNOSIS — I25810 Atherosclerosis of coronary artery bypass graft(s) without angina pectoris: Secondary | ICD-10-CM | POA: Diagnosis not present

## 2019-12-18 DIAGNOSIS — E782 Mixed hyperlipidemia: Secondary | ICD-10-CM | POA: Diagnosis not present

## 2019-12-20 DIAGNOSIS — E782 Mixed hyperlipidemia: Secondary | ICD-10-CM | POA: Diagnosis not present

## 2019-12-20 DIAGNOSIS — I25810 Atherosclerosis of coronary artery bypass graft(s) without angina pectoris: Secondary | ICD-10-CM | POA: Diagnosis not present

## 2019-12-20 DIAGNOSIS — Z951 Presence of aortocoronary bypass graft: Secondary | ICD-10-CM | POA: Diagnosis not present

## 2019-12-24 DIAGNOSIS — I25810 Atherosclerosis of coronary artery bypass graft(s) without angina pectoris: Secondary | ICD-10-CM | POA: Diagnosis not present

## 2019-12-24 DIAGNOSIS — E782 Mixed hyperlipidemia: Secondary | ICD-10-CM | POA: Diagnosis not present

## 2019-12-24 DIAGNOSIS — Z951 Presence of aortocoronary bypass graft: Secondary | ICD-10-CM | POA: Diagnosis not present

## 2019-12-26 DIAGNOSIS — I25810 Atherosclerosis of coronary artery bypass graft(s) without angina pectoris: Secondary | ICD-10-CM | POA: Diagnosis not present

## 2019-12-26 DIAGNOSIS — Z951 Presence of aortocoronary bypass graft: Secondary | ICD-10-CM | POA: Diagnosis not present

## 2019-12-26 DIAGNOSIS — E782 Mixed hyperlipidemia: Secondary | ICD-10-CM | POA: Diagnosis not present

## 2019-12-31 DIAGNOSIS — E782 Mixed hyperlipidemia: Secondary | ICD-10-CM | POA: Diagnosis not present

## 2019-12-31 DIAGNOSIS — I25810 Atherosclerosis of coronary artery bypass graft(s) without angina pectoris: Secondary | ICD-10-CM | POA: Diagnosis not present

## 2019-12-31 DIAGNOSIS — Z951 Presence of aortocoronary bypass graft: Secondary | ICD-10-CM | POA: Diagnosis not present

## 2020-01-02 DIAGNOSIS — I25810 Atherosclerosis of coronary artery bypass graft(s) without angina pectoris: Secondary | ICD-10-CM | POA: Diagnosis not present

## 2020-01-02 DIAGNOSIS — E782 Mixed hyperlipidemia: Secondary | ICD-10-CM | POA: Diagnosis not present

## 2020-01-02 DIAGNOSIS — Z951 Presence of aortocoronary bypass graft: Secondary | ICD-10-CM | POA: Diagnosis not present

## 2020-01-04 ENCOUNTER — Other Ambulatory Visit: Payer: Self-pay

## 2020-01-04 MED ORDER — ATORVASTATIN CALCIUM 40 MG PO TABS
40.0000 mg | ORAL_TABLET | Freq: Every day | ORAL | 3 refills | Status: DC
Start: 2020-01-04 — End: 2020-04-02

## 2020-01-04 NOTE — Telephone Encounter (Signed)
Pt had recent heart surgery and his cardiologist increased his atorvastatin from 20mg  to 40mg . He needs a new Rx for 40mg  sent to [facility].

## 2020-01-07 DIAGNOSIS — Z951 Presence of aortocoronary bypass graft: Secondary | ICD-10-CM | POA: Diagnosis not present

## 2020-01-09 DIAGNOSIS — Z951 Presence of aortocoronary bypass graft: Secondary | ICD-10-CM | POA: Diagnosis not present

## 2020-01-21 DIAGNOSIS — Z951 Presence of aortocoronary bypass graft: Secondary | ICD-10-CM | POA: Diagnosis not present

## 2020-01-23 DIAGNOSIS — Z951 Presence of aortocoronary bypass graft: Secondary | ICD-10-CM | POA: Diagnosis not present

## 2020-01-28 DIAGNOSIS — Z951 Presence of aortocoronary bypass graft: Secondary | ICD-10-CM | POA: Diagnosis not present

## 2020-01-30 DIAGNOSIS — Z951 Presence of aortocoronary bypass graft: Secondary | ICD-10-CM | POA: Diagnosis not present

## 2020-02-04 DIAGNOSIS — Z951 Presence of aortocoronary bypass graft: Secondary | ICD-10-CM | POA: Diagnosis not present

## 2020-02-06 DIAGNOSIS — Z951 Presence of aortocoronary bypass graft: Secondary | ICD-10-CM | POA: Diagnosis not present

## 2020-02-11 DIAGNOSIS — Z951 Presence of aortocoronary bypass graft: Secondary | ICD-10-CM | POA: Diagnosis not present

## 2020-02-13 DIAGNOSIS — Z951 Presence of aortocoronary bypass graft: Secondary | ICD-10-CM | POA: Diagnosis not present

## 2020-03-26 ENCOUNTER — Other Ambulatory Visit: Payer: Self-pay | Admitting: Internal Medicine

## 2020-03-28 ENCOUNTER — Encounter: Payer: Medicare Other | Admitting: Internal Medicine

## 2020-04-02 ENCOUNTER — Ambulatory Visit (INDEPENDENT_AMBULATORY_CARE_PROVIDER_SITE_OTHER): Payer: Medicare Other | Admitting: Internal Medicine

## 2020-04-02 ENCOUNTER — Encounter: Payer: Self-pay | Admitting: Internal Medicine

## 2020-04-02 ENCOUNTER — Other Ambulatory Visit: Payer: Self-pay

## 2020-04-02 VITALS — BP 118/72 | HR 58 | Temp 97.7°F | Ht 68.0 in | Wt 190.0 lb

## 2020-04-02 DIAGNOSIS — N486 Induration penis plastica: Secondary | ICD-10-CM

## 2020-04-02 DIAGNOSIS — Z0000 Encounter for general adult medical examination without abnormal findings: Secondary | ICD-10-CM | POA: Diagnosis not present

## 2020-04-02 DIAGNOSIS — M1000 Idiopathic gout, unspecified site: Secondary | ICD-10-CM

## 2020-04-02 DIAGNOSIS — I2510 Atherosclerotic heart disease of native coronary artery without angina pectoris: Secondary | ICD-10-CM

## 2020-04-02 DIAGNOSIS — M720 Palmar fascial fibromatosis [Dupuytren]: Secondary | ICD-10-CM | POA: Diagnosis not present

## 2020-04-02 DIAGNOSIS — Z7189 Other specified counseling: Secondary | ICD-10-CM

## 2020-04-02 DIAGNOSIS — E785 Hyperlipidemia, unspecified: Secondary | ICD-10-CM | POA: Diagnosis not present

## 2020-04-02 LAB — COMPREHENSIVE METABOLIC PANEL
ALT: 15 U/L (ref 0–53)
AST: 19 U/L (ref 0–37)
Albumin: 4.3 g/dL (ref 3.5–5.2)
Alkaline Phosphatase: 69 U/L (ref 39–117)
BUN: 11 mg/dL (ref 6–23)
CO2: 30 mEq/L (ref 19–32)
Calcium: 9.4 mg/dL (ref 8.4–10.5)
Chloride: 102 mEq/L (ref 96–112)
Creatinine, Ser: 0.73 mg/dL (ref 0.40–1.50)
GFR: 94.6 mL/min (ref >60.00)
Glucose, Bld: 91 mg/dL (ref 70–99)
Potassium: 4.1 mEq/L (ref 3.5–5.1)
Sodium: 138 mEq/L (ref 135–145)
Total Bilirubin: 0.9 mg/dL (ref 0.2–1.2)
Total Protein: 6.9 g/dL (ref 6.0–8.3)

## 2020-04-02 LAB — CBC
HCT: 39.2 % (ref 39.0–52.0)
Hemoglobin: 13.2 g/dL (ref 13.0–17.0)
MCHC: 33.8 g/dL (ref 30.0–36.0)
MCV: 94 fl (ref 78.0–100.0)
Platelets: 223 10*3/uL (ref 150.0–400.0)
RBC: 4.17 Mil/uL — ABNORMAL LOW (ref 4.22–5.81)
RDW: 14.3 % (ref 11.5–15.5)
WBC: 6.9 10*3/uL (ref 4.0–10.5)

## 2020-04-02 LAB — LIPID PANEL
Cholesterol: 195 mg/dL (ref 0–200)
HDL: 58.6 mg/dL (ref >39.00)
NonHDL: 136.54
Total CHOL/HDL Ratio: 3
Triglycerides: 203 mg/dL — ABNORMAL HIGH (ref 0.0–149.0)
VLDL: 40.6 mg/dL — ABNORMAL HIGH (ref 0.0–40.0)

## 2020-04-02 LAB — LDL CHOLESTEROL, DIRECT: Direct LDL: 102 mg/dL

## 2020-04-02 MED ORDER — SILDENAFIL CITRATE 20 MG PO TABS
60.0000 mg | ORAL_TABLET | Freq: Every day | ORAL | 11 refills | Status: DC | PRN
Start: 2020-04-02 — End: 2021-11-20

## 2020-04-02 MED ORDER — ATORVASTATIN CALCIUM 40 MG PO TABS
40.0000 mg | ORAL_TABLET | Freq: Every day | ORAL | 3 refills | Status: DC
Start: 2020-04-02 — End: 2021-04-23

## 2020-04-02 NOTE — Assessment & Plan Note (Signed)
Wants to see urologist about this

## 2020-04-02 NOTE — Assessment & Plan Note (Signed)
See social history 

## 2020-04-02 NOTE — Assessment & Plan Note (Signed)
One episode Not sure if the MTP mass could be from that Discussed podiatrist

## 2020-04-02 NOTE — Assessment & Plan Note (Signed)
Now on statin for secondary prevention

## 2020-04-02 NOTE — Assessment & Plan Note (Signed)
CABG in April Doing well on statin, metoprolol, ASA Will check labs

## 2020-04-02 NOTE — Progress Notes (Signed)
Hearing Screening   Method: Audiometry   125Hz  250Hz  500Hz  1000Hz  2000Hz  3000Hz  4000Hz  6000Hz  8000Hz   Right ear:   20 20 20   0    Left ear:   20 20 20  20     Vision Screening Comments: September 2021

## 2020-04-02 NOTE — Progress Notes (Signed)
Subjective:    Patient ID: Johnathan Lambert, male    DOB: [birth year], 69 y.o.   MRN: [id number]  HPI Here for Medicare wellness visit and follow up of chronic health conditions This visit occurred during the SARS-CoV-2 public health emergency.  Safety protocols were in place, including screening questions prior to the visit, additional usage of staff PPE, and extensive cleaning of exam room while observing appropriate contact time as indicated for disinfecting solutions.   Reviewed form and advanced directives Reviewed other doctors Drinking beer regularly---- up to 6 per day No tobacco Vision is fine Hearing is good No falls No depression or anhedonia Independent with instrumental ADLs No sig memory issues  Did work on fitness and walking more Started with burning in chest when jogging--November and on In March, was out doing yard work---got dizzy and "serious burn in the middle of my chest" Got in with cardiologist 2 weeks later Then had cath---and wound up with CABG Has recovered well Did cardiac rehab for about 3 months Feels much better now Activity level is back up Now on metoprolol and atorvastatin No chest pain or SOB No dizziness or syncope Now on full ASA 325mg   Having contracture in left 2nd finger and 4th Has knots   No recent issues with gout Has knot on left 1st MTP---thinks it is related to the gout, not bunion Some pain depending on his shoes  Still having issues with Peyronnie's disease Wants to see specialist about this Satisfied with the sildenafil  Current Outpatient Medications on File Prior to Visit  Medication Sig Dispense Refill  . aspirin 325 MG tablet Take 1 tablet by mouth.    Marland Kitchen atorvastatin (LIPITOR) 40 MG tablet Take 1 tablet (40 mg total) by mouth daily. 90 tablet 3  . COLCRYS 0.6 MG tablet Take 1 tablet (0.6 mg total) by mouth 2 (two) times daily as needed. 90 tablet 1  . dorzolamide-timolol (COSOPT) 22.3-6.8 MG/ML ophthalmic solution  Place 1 drop into both eyes 2 (two) times daily.    . metoprolol tartrate (LOPRESSOR) 25 MG tablet Take 0.5 tablets by mouth in the morning and at bedtime.    . Multiple Vitamin (MULTIVITAMIN) tablet Take 1 tablet by mouth daily.      . sertraline (ZOLOFT) 50 MG tablet TAKE 1 TABLET BY MOUTH EVERY DAY 90 tablet 3  . sildenafil (REVATIO) 20 MG tablet TAKE 3 TO 5 TABLETS BY MOUTH ONCE DAILY AS NEEDED 50 tablet 11   No current facility-administered medications on file prior to visit.    No Known Allergies  Past Medical History:  Diagnosis Date  . Broken arm 1990   cast  . Cataract   . FH: colonic polyps   . Glaucoma   . Gout   . Hyperlipidemia   . Melanoma of back ([facility]) 1/16    Past Surgical History:  Procedure Laterality Date  . CATARACT EXTRACTION W/ INTRAOCULAR LENS  IMPLANT, BILATERAL  3/12   both eyes  . COLONOSCOPY    . CORONARY ARTERY BYPASS GRAFT  09/2019   [facility]  . SHOULDER SURGERY  1985   Right    Family History  Problem Relation Age of Onset  . Hyperlipidemia Mother   . Hyperthyroidism Mother   . Heart disease Father 10       MI and CAD  . Hyperthyroidism Sister   . Hyperthyroidism Brother   . Cancer Paternal Uncle        lung  . Colon cancer  Neg Hx   . Esophageal cancer Neg Hx   . Rectal cancer Neg Hx   . Stomach cancer Neg Hx     Social History   Socioeconomic History  . Marital status: Married    Spouse name: Not on file  . Number of children: 2  . Years of education: Not on file  . Highest education level: Not on file  Occupational History  . Occupation: Science writer    Comment: [employer]--retired  Tobacco Use  . Smoking status: Never Smoker  . Smokeless tobacco: Never Used  Vaping Use  . Vaping Use: Never used  Substance and Sexual Activity  . Alcohol use: Yes    Comment: Occassionally  . Drug use: No  . Sexual activity: Not on file  Other Topics Concern  . Not on file  Social History Narrative   Walks and enjoys golf.       No  living will   Wife should make decisions--- alternate is daughter   Would accept resuscitation   Would accept tube feeds---at least temporarily   Social Determinants of Health   Financial Resource Strain:   . Difficulty of Paying Living Expenses: Not on file  Food Insecurity:   . Worried About Charity fundraiser in the Last Year: Not on file  . Ran Out of Food in the Last Year: Not on file  Transportation Needs:   . Lack of Transportation (Medical): Not on file  . Lack of Transportation (Non-Medical): Not on file  Physical Activity:   . Days of Exercise per Week: Not on file  . Minutes of Exercise per Session: Not on file  Stress:   . Feeling of Stress : Not on file  Social Connections:   . Frequency of Communication with Friends and Family: Not on file  . Frequency of Social Gatherings with Friends and Family: Not on file  . Attends Religious Services: Not on file  . Active Member of Clubs or Organizations: Not on file  . Attends Archivist Meetings: Not on file  . Marital Status: Not on file  Intimate Partner Violence:   . Fear of Current or Ex-Partner: Not on file  . Emotionally Abused: Not on file  . Physically Abused: Not on file  . Sexually Abused: Not on file   Review of Systems Appetite is good Weight is stable Teeth are fine--sees dentist Wears seat belt No heartburn or dysphagia Bowels are fine---no blood No other joint or back pain Voids fine---good stream. Rare nocturia No skin issues    Objective:   Physical Exam Constitutional:      Appearance: Normal appearance.  HENT:     Mouth/Throat:     Comments: No lesions Eyes:     Conjunctiva/sclera: Conjunctivae normal.     Pupils: Pupils are equal, round, and reactive to light.  Cardiovascular:     Rate and Rhythm: Normal rate and regular rhythm.     Pulses: Normal pulses.     Heart sounds: No murmur heard.  No gallop.   Pulmonary:     Effort: Pulmonary effort is normal.     Breath  sounds: Normal breath sounds. No wheezing or rales.  Abdominal:     Palpations: Abdomen is soft.     Tenderness: There is no abdominal tenderness.  Musculoskeletal:     Cervical back: Neck supple.     Right lower leg: No edema.     Left lower leg: No edema.     Comments:  Hard mass at left 1st MTP. No joint deviation that would point to bunion (okay to see podiatrist to get this evaluated)  Lymphadenopathy:     Cervical: No cervical adenopathy.  Skin:    General: Skin is warm.     Findings: No rash.  Neurological:     Mental Status: He is alert and oriented to person, place, and time.     Comments: Shon Millet, Obama" [phone] [id number] Recall 2/3  Psychiatric:        Mood and Affect: Mood normal.        Behavior: Behavior normal.            Assessment & Plan:

## 2020-04-02 NOTE — Assessment & Plan Note (Signed)
Left hand Some palmar erythema--discussed decreasing the beer Will set up with hand surgeon

## 2020-04-02 NOTE — Assessment & Plan Note (Signed)
I have personally reviewed the Medicare Annual Wellness questionnaire and have noted 1. The patient's medical and social history 2. Their use of alcohol, tobacco or illicit drugs 3. Their current medications and supplements 4. The patient's functional ability including ADL's, fall risks, home safety risks and hearing or visual             impairment. 5. Diet and physical activities 6. Evidence for depression or mood disorders  The patients weight, height, BMI and visual acuity have been recorded in the chart I have made referrals, counseling and provided education to the patient based review of the above and I have provided the pt with a written personalized care plan for preventive services.  I have provided you with a copy of your personalized plan for preventive services. Please take the time to review along with your updated medication list.  Defer PSA to next year Colon due again 2023--due to polyp Prefers no vaccines Trying to exercise regularly

## 2020-04-14 DIAGNOSIS — M65322 Trigger finger, left index finger: Secondary | ICD-10-CM | POA: Diagnosis not present

## 2020-04-14 DIAGNOSIS — M720 Palmar fascial fibromatosis [Dupuytren]: Secondary | ICD-10-CM | POA: Diagnosis not present

## 2020-04-14 DIAGNOSIS — M65332 Trigger finger, left middle finger: Secondary | ICD-10-CM | POA: Diagnosis not present

## 2020-04-15 DIAGNOSIS — M109 Gout, unspecified: Secondary | ICD-10-CM | POA: Diagnosis not present

## 2020-04-15 DIAGNOSIS — M25572 Pain in left ankle and joints of left foot: Secondary | ICD-10-CM | POA: Diagnosis not present

## 2020-04-15 DIAGNOSIS — M79672 Pain in left foot: Secondary | ICD-10-CM | POA: Diagnosis not present

## 2020-04-25 ENCOUNTER — Ambulatory Visit (INDEPENDENT_AMBULATORY_CARE_PROVIDER_SITE_OTHER): Payer: Medicare Other | Admitting: Urology

## 2020-04-25 ENCOUNTER — Other Ambulatory Visit: Payer: Self-pay

## 2020-04-25 ENCOUNTER — Encounter: Payer: Self-pay | Admitting: Urology

## 2020-04-25 VITALS — BP 108/64 | HR 83 | Ht 68.0 in | Wt 185.0 lb

## 2020-04-25 DIAGNOSIS — N486 Induration penis plastica: Secondary | ICD-10-CM | POA: Diagnosis not present

## 2020-04-25 DIAGNOSIS — I2510 Atherosclerotic heart disease of native coronary artery without angina pectoris: Secondary | ICD-10-CM | POA: Diagnosis not present

## 2020-04-25 DIAGNOSIS — N5203 Combined arterial insufficiency and corporo-venous occlusive erectile dysfunction: Secondary | ICD-10-CM | POA: Diagnosis not present

## 2020-04-25 NOTE — Progress Notes (Signed)
04/25/2020 9:10 AM   Johnathan Lambert 08/19/1950 [id number]  Referring provider: Venia Carbon, MD [address] [place],  [place] [address]  Chief Complaint  Patient presents with  . Abnormal Penile Curvature    New Patient    HPI: 69 year old male who presents today for further evaluation of Peyronie's disease  He reports that his symptoms started many years ago.  He has stable penile curvature which is "upwards" which is approximately 60 degrees per his report.  The curve is primarily closer to the end of his penis.  His curvature is nonpainful.  He does make penetration sometimes difficult or uncomfortable.  He denies any penile trauma.  No penile pain.  He does have erectile dysfunction which is responsive to PDE 5 inhibitors.  He reports that he gets a good stable full erection adequate for penetration with 20 to 40 mg tablets of sildenafil.  He has been using this for several years.  He does have a personal history of Dupuytren's contractures which are worsening.  He is scheduled to see a Copy.   PMH: Past Medical History:  Diagnosis Date  . Broken arm 1990   cast  . Cataract   . FH: colonic polyps   . Glaucoma   . Gout   . Hyperlipidemia   . Melanoma of back [facility]) 1/16    Surgical History: Past Surgical History:  Procedure Laterality Date  . CATARACT EXTRACTION W/ INTRAOCULAR LENS  IMPLANT, BILATERAL  3/12   both eyes  . COLONOSCOPY    . CORONARY ARTERY BYPASS GRAFT  09/2019   [facility]  . SHOULDER SURGERY  1985   Right    Home Medications:  Allergies as of 04/25/2020   No Known Allergies     Medication List       Accurate as of April 25, 2020 11:59 PM. If you have any questions, ask your nurse or doctor.        aspirin 325 MG tablet Take 1 tablet by mouth.   atorvastatin 40 MG tablet Commonly known as: LIPITOR Take 1 tablet (40 mg total) by mouth daily.   Colcrys 0.6 MG tablet Generic drug: colchicine Take 1 tablet (0.6  mg total) by mouth 2 (two) times daily as needed.   dorzolamide-timolol 22.3-6.8 MG/ML ophthalmic solution Commonly known as: COSOPT Place 1 drop into both eyes 2 (two) times daily.   metoprolol tartrate 25 MG tablet Commonly known as: LOPRESSOR Take 0.5 tablets by mouth in the morning and at bedtime.   multivitamin tablet Take 1 tablet by mouth daily.   sertraline 50 MG tablet Commonly known as: ZOLOFT TAKE 1 TABLET BY MOUTH EVERY DAY   sildenafil 20 MG tablet Commonly known as: REVATIO Take 3-5 tablets (60-100 mg total) by mouth daily as needed.       Allergies: No Known Allergies  Family History: Family History  Problem Relation Age of Onset  . Hyperlipidemia Mother   . Hyperthyroidism Mother   . Heart disease Father 25       MI and CAD  . Hyperthyroidism Sister   . Hyperthyroidism Brother   . Cancer Paternal Uncle        lung  . Colon cancer Neg Hx   . Esophageal cancer Neg Hx   . Rectal cancer Neg Hx   . Stomach cancer Neg Hx   . Prostate cancer Neg Hx   . Bladder Cancer Neg Hx   . Kidney cancer Neg Hx  Social History:  reports that he has never smoked. He has never used smokeless tobacco. He reports current alcohol use. He reports that he does not use drugs.   Physical Exam: BP 108/64   Pulse 83   Ht 5\' 8"  (1.727 m)   Wt 185 lb (83.9 kg)   BMI 28.13 kg/m   Constitutional:  Alert and oriented, No acute distress. HEENT: Gerald AT, moist mucus membranes.  Trachea midline, no masses. Cardiovascular: No clubbing, cyanosis, or edema. Respiratory: Normal respiratory effort, no increased work of breathing. GI: Abdomen is soft, nontender, nondistended, no abdominal masses GU: Circumcised phallus with several centimeter long dorsal plaque in the midline.  Bilateral descended testicles. Skin: No rashes, bruises or suspicious lesions. Neurologic: Grossly intact, no focal deficits, moving all 4 extremities. Psychiatric: Normal mood and affect. MSK: Multiple  Dupuytren's contractures noted bilaterally on hands.  Laboratory Data: Lab Results  Component Value Date   WBC 6.9 04/02/2020   HGB 13.2 04/02/2020   HCT 39.2 04/02/2020   MCV 94.0 04/02/2020   PLT 223.0 04/02/2020    Lab Results  Component Value Date   CREATININE 0.73 04/02/2020    Lab Results  Component Value Date   PSA 0.39 03/23/2019   PSA 0.42 03/18/2017   PSA 0.33 03/04/2015     Assessment & Plan:    1. Peyronie's disease Based on patient's history of physical exam, findings are consistent with Peyronie's disease.  Pathophysiology was discussed at length including the 2 phases of the diease, acute and chronic.  Treatment options and goals of treatment were discussed today in detail. Options including observation, penile plaque and graft, penile plication, placement of penile prosthesis, and injection of collagenase were all reviewed.  An benefits of each were discussed at length.  He seems to be most interested in collagenase injections with the medication Xiaflex.  We discussed that this medication is administered in cycles which include 2 injections of the medication into the penile plaque followed by modeling procedure with 6 weeks of home exercises. Goals of treatment were reviewed which include improvement of penile curvature ideally to facilitate sexual function/penetration but likely not complete resolution of the curvature. Risks including failure of the medication, penile hematoma/edema, pain, and penile fracture were all discussed. All of his questions were answered.  He'll call us to let us know if it like to schedule cycle this medication and we will arrange for insurance investigation. He will need an induction of an erection with measurement as part of this evaluation.  2. Combined arterial insufficiency and corporo-venous occlusive erectile dysfunction Doing well on sildenafil, use as needed   Hollice Espy, MD  [facility] [address], [place], [place] [address] [phone]  I spent 45 total minutes on the day of the encounter including pre-visit review of the medical record, face-to-face time with the patient, and post visit ordering of labs/imaging/tests.

## 2020-04-28 DIAGNOSIS — R5383 Other fatigue: Secondary | ICD-10-CM | POA: Diagnosis not present

## 2020-04-28 DIAGNOSIS — Z79899 Other long term (current) drug therapy: Secondary | ICD-10-CM | POA: Diagnosis not present

## 2020-04-28 DIAGNOSIS — M1A9XX1 Chronic gout, unspecified, with tophus (tophi): Secondary | ICD-10-CM | POA: Diagnosis not present

## 2020-04-28 DIAGNOSIS — M1009 Idiopathic gout, multiple sites: Secondary | ICD-10-CM | POA: Diagnosis not present

## 2020-05-26 DIAGNOSIS — H401114 Primary open-angle glaucoma, right eye, indeterminate stage: Secondary | ICD-10-CM | POA: Diagnosis not present

## 2020-05-26 DIAGNOSIS — H5203 Hypermetropia, bilateral: Secondary | ICD-10-CM | POA: Diagnosis not present

## 2020-05-26 DIAGNOSIS — D225 Melanocytic nevi of trunk: Secondary | ICD-10-CM | POA: Diagnosis not present

## 2020-05-26 DIAGNOSIS — L570 Actinic keratosis: Secondary | ICD-10-CM | POA: Diagnosis not present

## 2020-05-26 DIAGNOSIS — H52222 Regular astigmatism, left eye: Secondary | ICD-10-CM | POA: Diagnosis not present

## 2020-05-26 DIAGNOSIS — L821 Other seborrheic keratosis: Secondary | ICD-10-CM | POA: Diagnosis not present

## 2020-05-26 DIAGNOSIS — H524 Presbyopia: Secondary | ICD-10-CM | POA: Diagnosis not present

## 2020-05-26 DIAGNOSIS — L814 Other melanin hyperpigmentation: Secondary | ICD-10-CM | POA: Diagnosis not present

## 2020-05-26 DIAGNOSIS — Z85820 Personal history of malignant melanoma of skin: Secondary | ICD-10-CM | POA: Diagnosis not present

## 2020-05-27 DIAGNOSIS — Z79899 Other long term (current) drug therapy: Secondary | ICD-10-CM | POA: Diagnosis not present

## 2020-05-27 DIAGNOSIS — R5383 Other fatigue: Secondary | ICD-10-CM | POA: Diagnosis not present

## 2020-05-27 DIAGNOSIS — M1A9XX1 Chronic gout, unspecified, with tophus (tophi): Secondary | ICD-10-CM | POA: Diagnosis not present

## 2020-05-27 DIAGNOSIS — M1009 Idiopathic gout, multiple sites: Secondary | ICD-10-CM | POA: Diagnosis not present

## 2020-05-28 ENCOUNTER — Other Ambulatory Visit: Payer: Self-pay | Admitting: Internal Medicine

## 2020-07-23 ENCOUNTER — Telehealth: Payer: Self-pay | Admitting: Internal Medicine

## 2020-07-23 NOTE — Telephone Encounter (Signed)
We have 20mg  and 40mg  on file and both [facility] refills at [facility]. He needs to contact the pharmacy. Spoke to pt. He is on 40mg . He will contact the pharmacy to have them fill it.

## 2020-07-23 NOTE — Telephone Encounter (Signed)
Pt called in needed a refill on the lipitor and its 90 day supply ,  Pharmacy: [facility] [address], [place]

## 2021-03-24 ENCOUNTER — Other Ambulatory Visit: Payer: Self-pay | Admitting: Internal Medicine

## 2021-04-03 ENCOUNTER — Encounter: Payer: Medicare Other | Admitting: Internal Medicine

## 2021-04-23 ENCOUNTER — Other Ambulatory Visit: Payer: Self-pay | Admitting: Internal Medicine

## 2021-06-18 ENCOUNTER — Other Ambulatory Visit: Payer: Self-pay | Admitting: Internal Medicine

## 2021-07-13 ENCOUNTER — Other Ambulatory Visit: Payer: Self-pay

## 2021-07-13 ENCOUNTER — Encounter: Payer: Self-pay | Admitting: Internal Medicine

## 2021-07-13 ENCOUNTER — Ambulatory Visit (INDEPENDENT_AMBULATORY_CARE_PROVIDER_SITE_OTHER): Payer: Medicare Other | Admitting: Internal Medicine

## 2021-07-13 VITALS — BP 118/74 | HR 70 | Temp 97.7°F | Ht 68.0 in | Wt 194.0 lb

## 2021-07-13 DIAGNOSIS — Z7189 Other specified counseling: Secondary | ICD-10-CM

## 2021-07-13 DIAGNOSIS — Z0000 Encounter for general adult medical examination without abnormal findings: Secondary | ICD-10-CM

## 2021-07-13 DIAGNOSIS — I2510 Atherosclerotic heart disease of native coronary artery without angina pectoris: Secondary | ICD-10-CM | POA: Diagnosis not present

## 2021-07-13 DIAGNOSIS — M1000 Idiopathic gout, unspecified site: Secondary | ICD-10-CM

## 2021-07-13 DIAGNOSIS — Z125 Encounter for screening for malignant neoplasm of prostate: Secondary | ICD-10-CM

## 2021-07-13 DIAGNOSIS — F39 Unspecified mood [affective] disorder: Secondary | ICD-10-CM

## 2021-07-13 NOTE — Assessment & Plan Note (Signed)
I have personally reviewed the Medicare Annual Wellness questionnaire and have noted 1. The patient's medical and social history 2. Their use of alcohol, tobacco or illicit drugs 3. Their current medications and supplements 4. The patient's functional ability including ADL's, fall risks, home safety risks and hearing or visual             impairment. 5. Diet and physical activities 6. Evidence for depression or mood disorders  The patients weight, height, BMI and visual acuity have been recorded in the chart I have made referrals, counseling and provided education to the patient based review of the above and I have provided the pt with a written personalized care plan for preventive services.  I have provided you with a copy of your personalized plan for preventive services. Please take the time to review along with your updated medication list.  Colon due in July Discussed PSA will recheck Prefers no vaccines---flu, COVID, pneumovax or shingrix Discussed exercise

## 2021-07-13 NOTE — Assessment & Plan Note (Signed)
Mild anxiety that has been well controlled on sertraline 50mg  Will continue after discussion

## 2021-07-13 NOTE — Assessment & Plan Note (Signed)
Quiet On ASA 81, atorvastatin 40, metoprolol 12.5 bid

## 2021-07-13 NOTE — Progress Notes (Signed)
Hearing Screening - Comments:: Passed whisper test °Vision Screening - Comments:: August 2022 ° °

## 2021-07-13 NOTE — Assessment & Plan Note (Signed)
See social history 

## 2021-07-13 NOTE — Progress Notes (Signed)
Subjective:    Patient ID: Johnathan Lambert, male    DOB: May 04, 1951, 71 y.o.   MRN: [id number]  HPI Here for Medicare wellness visit and follow up of chronic health conditions Reviewed advanced directives Reviewed other doctors--Dr Brandon---urologist, Dr Vern Claude, Dr Orvis Brill, Dr Pasi--cardiologist, Dr Rutherford Nail, Dr Su--dentist, Dr Marnette Burgess surgeon No hospitalizations in the past year Had surgery in November to release Dupuytren's contractures (successful) Still enjoys beer---usually 4-6 per day----discussed No set exercise--but stays active Vision is okay--on drops for glaucoma Hearing is fine No falls Independent with instrumental ADLs No sig memory problems---just writes himself more notes  No problems with heart Exercise tolerance is better now Plays golf---fishes, etc (to stay active) Walks with wife No chest pain No palpitations No edema No dizziness or syncope No SOB  Did see urologist about Peyronnie's Has deferred treatment Satisfied with sildenafil  Continues on sertraline for anxiety Had been having breathing problems with his nerves No depression or anhedonia  No recent gout problems Now on allopurinol  Current Outpatient Medications on File Prior to Visit  Medication Sig Dispense Refill   allopurinol (ZYLOPRIM) 300 MG tablet Take 300 mg by mouth daily.     aspirin 81 MG EC tablet Take 1 tablet by mouth daily.     atorvastatin (LIPITOR) 40 MG tablet TAKE 1 TABLET BY MOUTH EVERY DAY 90 tablet 1   COLCRYS 0.6 MG tablet Take 1 tablet (0.6 mg total) by mouth 2 (two) times daily as needed. 90 tablet 1   dorzolamide-timolol (COSOPT) 22.3-6.8 MG/ML ophthalmic solution Place 1 drop into both eyes 2 (two) times daily.     metoprolol tartrate (LOPRESSOR) 25 MG tablet Take 0.5 tablets by mouth in the morning and at bedtime.     Multiple Vitamin (MULTIVITAMIN) tablet Take 1 tablet by mouth daily.       sertraline (ZOLOFT) 50 MG tablet TAKE 1  TABLET BY MOUTH EVERY DAY 90 tablet 0   sildenafil (REVATIO) 20 MG tablet Take 3-5 tablets (60-100 mg total) by mouth daily as needed. 50 tablet 11   No current facility-administered medications on file prior to visit.    No Known Allergies  Past Medical History:  Diagnosis Date   Broken arm 1990   cast   Cataract    FH: colonic polyps    Glaucoma    Gout    Hyperlipidemia    Melanoma of back ([place]) 1/16    Past Surgical History:  Procedure Laterality Date   CATARACT EXTRACTION W/ INTRAOCULAR LENS  IMPLANT, BILATERAL  3/12   both eyes   COLONOSCOPY     CORONARY ARTERY BYPASS GRAFT  09/2019   [facility]   SHOULDER SURGERY  1985   Right    Family History  Problem Relation Age of Onset   Hyperlipidemia Mother    Hyperthyroidism Mother    Heart disease Father 59       MI and CAD   Hyperthyroidism Sister    Hyperthyroidism Brother    Cancer Paternal Uncle        lung   Colon cancer Neg Hx    Esophageal cancer Neg Hx    Rectal cancer Neg Hx    Stomach cancer Neg Hx    Prostate cancer Neg Hx    Bladder Cancer Neg Hx    Kidney cancer Neg Hx     Social History   Socioeconomic History   Marital status: Married    Spouse name: Not on file   Number  of children: 2   Years of education: Not on file   Highest education level: Not on file  Occupational History   Occupation: Science writer    Comment: [employer]--retired  Tobacco Use   Smoking status: Never   Smokeless tobacco: Never  Vaping Use   Vaping Use: Never used  Substance and Sexual Activity   Alcohol use: Yes    Comment: Occassionally   Drug use: No   Sexual activity: Not on file  Other Topics Concern   Not on file  Social History Narrative   Walks and enjoys golf.       No living will   Wife should make decisions--- alternate is daughter   Would accept resuscitation   Would accept tube feeds---at least temporarily   Social Determinants of Health   Financial Resource Strain: Not on file  Food  Insecurity: Not on file  Transportation Needs: Not on file  Physical Activity: Not on file  Stress: Not on file  Social Connections: Not on file  Intimate Partner Violence: Not on file   Review of Systems Appetite is good Weight is up a few pounds---winter No suspicious skin lesions Sleeps well Teeth are fine Wears seat belt No heartburn or dysphagia Bowels move fine---no blood Urine stream is fine---no issues emptying No other back or joint pains---just typical aches (like after golf)    Objective:   Physical Exam HENT:     Mouth/Throat:     Comments: No lesions Eyes:     Conjunctiva/sclera: Conjunctivae normal.     Pupils: Pupils are equal, round, and reactive to light.  Cardiovascular:     Rate and Rhythm: Normal rate and regular rhythm.     Pulses: Normal pulses.     Heart sounds: No murmur heard.   No gallop.  Pulmonary:     Effort: Pulmonary effort is normal.     Breath sounds: Normal breath sounds. No wheezing or rales.  Abdominal:     Palpations: Abdomen is soft.     Tenderness: There is no abdominal tenderness.  Musculoskeletal:     Cervical back: Neck supple.     Right lower leg: No edema.     Left lower leg: No edema.  Lymphadenopathy:     Cervical: No cervical adenopathy.  Skin:    Findings: No lesion or rash.  Neurological:     General: No focal deficit present.     Mental Status: He is oriented to person, place, and time.     Comments: Mini-cog normal  Psychiatric:        Mood and Affect: Mood normal.        Behavior: Behavior normal.           Assessment & Plan:

## 2021-07-13 NOTE — Assessment & Plan Note (Signed)
Now on allopurinol 300mg  daily Has the colcrys for prn use

## 2021-07-14 LAB — LIPID PANEL
Cholesterol: 178 mg/dL (ref 0–200)
HDL: 57.2 mg/dL (ref >39.00)
NonHDL: 121.15
Total CHOL/HDL Ratio: 3
Triglycerides: 316 mg/dL — ABNORMAL HIGH (ref 0.0–149.0)
VLDL: 63.2 mg/dL — ABNORMAL HIGH (ref 0.0–40.0)

## 2021-07-14 LAB — COMPREHENSIVE METABOLIC PANEL
ALT: 19 U/L (ref 0–53)
AST: 25 U/L (ref 0–37)
Albumin: 4.2 g/dL (ref 3.5–5.2)
Alkaline Phosphatase: 68 U/L (ref 39–117)
BUN: 12 mg/dL (ref 6–23)
CO2: 30 mEq/L (ref 19–32)
Calcium: 9.5 mg/dL (ref 8.4–10.5)
Chloride: 104 mEq/L (ref 96–112)
Creatinine, Ser: 0.73 mg/dL (ref 0.40–1.50)
GFR: 92.28 mL/min (ref >60.00)
Glucose, Bld: 92 mg/dL (ref 70–99)
Potassium: 3.8 mEq/L (ref 3.5–5.1)
Sodium: 139 mEq/L (ref 135–145)
Total Bilirubin: 0.9 mg/dL (ref 0.2–1.2)
Total Protein: 6.7 g/dL (ref 6.0–8.3)

## 2021-07-14 LAB — PSA, MEDICARE: PSA: 0.4 ng/ml (ref 0.10–4.00)

## 2021-07-14 LAB — CBC
HCT: 40.2 % (ref 39.0–52.0)
Hemoglobin: 13.1 g/dL (ref 13.0–17.0)
MCHC: 32.7 g/dL (ref 30.0–36.0)
MCV: 98.4 fl (ref 78.0–100.0)
Platelets: 215 10*3/uL (ref 150.0–400.0)
RBC: 4.08 Mil/uL — ABNORMAL LOW (ref 4.22–5.81)
RDW: 14.1 % (ref 11.5–15.5)
WBC: 6.3 10*3/uL (ref 4.0–10.5)

## 2021-07-14 LAB — URIC ACID: Uric Acid, Serum: 4 mg/dL (ref 4.0–7.8)

## 2021-07-14 LAB — LDL CHOLESTEROL, DIRECT: Direct LDL: 87 mg/dL

## 2021-09-18 ENCOUNTER — Other Ambulatory Visit: Payer: Self-pay | Admitting: Internal Medicine

## 2021-10-22 ENCOUNTER — Other Ambulatory Visit: Payer: Self-pay | Admitting: Internal Medicine

## 2021-11-20 ENCOUNTER — Other Ambulatory Visit: Payer: Self-pay | Admitting: Internal Medicine

## 2022-01-20 ENCOUNTER — Encounter: Payer: Self-pay | Admitting: Gastroenterology

## 2022-04-06 ENCOUNTER — Encounter: Payer: Self-pay | Admitting: Gastroenterology

## 2022-04-08 ENCOUNTER — Encounter: Payer: Self-pay | Admitting: Gastroenterology

## 2022-04-22 ENCOUNTER — Ambulatory Visit (AMBULATORY_SURGERY_CENTER): Payer: Medicare Other

## 2022-04-22 VITALS — Ht 68.0 in | Wt 185.0 lb

## 2022-04-22 DIAGNOSIS — Z86010 Personal history of colonic polyps: Secondary | ICD-10-CM

## 2022-04-22 MED ORDER — PEG 3350-KCL-NA BICARB-NACL 420 G PO SOLR: 4000.0000 mL | Freq: Once | ORAL | 0 refills | Status: AC

## 2022-04-22 NOTE — Progress Notes (Signed)

## 2022-05-15 ENCOUNTER — Encounter: Payer: Self-pay | Admitting: Certified Registered Nurse Anesthetist

## 2022-05-18 ENCOUNTER — Ambulatory Visit (AMBULATORY_SURGERY_CENTER): Payer: Medicare Other | Admitting: Gastroenterology

## 2022-05-18 ENCOUNTER — Encounter: Payer: Self-pay | Admitting: Gastroenterology

## 2022-05-18 VITALS — BP 111/84 | HR 64 | Temp 96.6°F | Resp 10 | Ht 68.0 in | Wt 194.0 lb

## 2022-05-18 DIAGNOSIS — Z86010 Personal history of colonic polyps: Secondary | ICD-10-CM

## 2022-05-18 DIAGNOSIS — D123 Benign neoplasm of transverse colon: Secondary | ICD-10-CM

## 2022-05-18 DIAGNOSIS — Z09 Encounter for follow-up examination after completed treatment for conditions other than malignant neoplasm: Secondary | ICD-10-CM

## 2022-05-18 MED ORDER — SODIUM CHLORIDE 0.9 % IV SOLN: 500.0000 mL | Freq: Once | INTRAVENOUS | Status: DC

## 2022-05-18 NOTE — Progress Notes (Signed)
Pt's states no medical or surgical changes since previsit or office visit. 

## 2022-05-18 NOTE — Progress Notes (Signed)
Report given to PACU, vss 

## 2022-05-18 NOTE — Op Note (Signed)
[place] Patient Name: Viviano Bir Procedure Date: 05/18/2022 8:56 AM MRN: [id number] Endoscopist: Ladene Artist , MD, [id number] Age: 71 Referring MD:  Date of Birth: [birth year] Gender: Male Account #: [id number] Procedure:                Colonoscopy Indications:              Surveillance: Personal history of adenomatous                            polyps on last colonoscopy 3 years ago Medicines:                Monitored Anesthesia Care Procedure:                Pre-Anesthesia Assessment:                           - Prior to the procedure, a History and Physical                            was performed, and patient medications and                            allergies were reviewed. The patient's tolerance of                            previous anesthesia was also reviewed. The risks                            and benefits of the procedure and the sedation                            options and risks were discussed with the patient.                            All questions were answered, and informed consent                            was obtained. Prior Anticoagulants: The patient has                            taken no anticoagulant or antiplatelet agents. ASA                            Grade Assessment: III - A patient with severe                            systemic disease. After reviewing the risks and                            benefits, the patient was deemed in satisfactory                            condition to undergo the procedure.  After obtaining informed consent, the colonoscope                            was passed under direct vision. Throughout the                            procedure, the patient's blood pressure, pulse, and                            oxygen saturations were monitored continuously. The                            Colonoscope was introduced through the anus and                            advanced to the the  cecum, identified by                            appendiceal orifice and ileocecal valve. The                            ileocecal valve, appendiceal orifice, and rectum                            were photographed. The quality of the bowel                            preparation was good. The colonoscopy was performed                            without difficulty. The patient tolerated the                            procedure well. Scope In: 9:04:42 AM Scope Out: 9:14:43 AM Scope Withdrawal Time: 0 hours 8 minutes 44 seconds  Total Procedure Duration: 0 hours 10 minutes 1 second  Findings:                 The perianal and digital rectal examinations were                            normal.                           A 10 mm polyp was found in the transverse colon.                            The polyp was sessile. The polyp was removed with a                            cold snare. Resection and retrieval were complete.                           External hemorrhoids were found during  retroflexion. The hemorrhoids were small and Grade                            I (internal hemorrhoids that do not prolapse).                           The exam was otherwise without abnormality on                            direct and retroflexion views. Complications:            No immediate complications. Estimated blood loss:                            None. Estimated Blood Loss:     Estimated blood loss: none. Impression:               - One 10 mm polyp in the transverse colon, removed                            with a cold snare. Resected and retrieved.                           - External hemorrhoids.                           - The examination was otherwise normal on direct                            and retroflexion views. Recommendation:           - Repeat colonoscopy after studies are complete for                            surveillance.                           - Patient  has a contact number available for                            emergencies. The signs and symptoms of potential                            delayed complications were discussed with the                            patient. Return to normal activities tomorrow.                            Written discharge instructions were provided to the                            patient.                           - Resume previous diet.                           -  Continue present medications.                           - Await pathology results. Ladene Artist, MD 05/18/2022 9:17:52 AM This report has been signed electronically.

## 2022-05-18 NOTE — Patient Instructions (Signed)
YOU HAD AN ENDOSCOPIC PROCEDURE TODAY AT [facility]:   Refer to the procedure report that was given to you for any specific questions about what was found during the examination.  If the procedure report does not answer your questions, please call your gastroenterologist to clarify.  If you requested that your care partner not be given the details of your procedure findings, then the procedure report has been included in a sealed envelope for you to review at your convenience later.  **Handouts given on Polyps and Hemorrhoids**  YOU SHOULD EXPECT: Some feelings of bloating in the abdomen. Passage of more gas than usual.  Walking can help get rid of the air that was put into your GI tract during the procedure and reduce the bloating. If you had a lower endoscopy (such as a colonoscopy or flexible sigmoidoscopy) you may notice spotting of blood in your stool or on the toilet paper. If you underwent a bowel prep for your procedure, you may not have a normal bowel movement for a few days.  Please Note:  You might notice some irritation and congestion in your nose or some drainage.  This is from the oxygen used during your procedure.  There is no need for concern and it should clear up in a day or so.  SYMPTOMS TO REPORT IMMEDIATELY:  Following lower endoscopy (colonoscopy or flexible sigmoidoscopy):  Excessive amounts of blood in the stool  Significant tenderness or worsening of abdominal pains  Swelling of the abdomen that is new, acute  Fever of 100F or higher   For urgent or emergent issues, a gastroenterologist can be reached at any hour by calling [phone]. Do not use MyChart messaging for urgent concerns.    DIET:  We do recommend a small meal at first, but then you may proceed to your regular diet.  Drink plenty of fluids but you should avoid alcoholic beverages for 24 hours.  ACTIVITY:  You should plan to take it easy for the rest of today and you should NOT DRIVE  or use heavy machinery until tomorrow (because of the sedation medicines used during the test).    FOLLOW UP: Our staff will call the number listed on your records the next business day following your procedure.  We will call around 7:15- 8:00 am to check on you and address any questions or concerns that you may have regarding the information given to you following your procedure. If we do not reach you, we will leave a message.     If any biopsies were taken you will be contacted by phone or by letter within the next 1-3 weeks.  Please call us at [phone] if you have not heard about the biopsies in 3 weeks.    SIGNATURES/CONFIDENTIALITY: You and/or your care partner have signed paperwork which will be entered into your electronic medical record.  These signatures attest to the fact that that the information above on your After Visit Summary has been reviewed and is understood.  Full responsibility of the confidentiality of this discharge information lies with you and/or your care-partner.

## 2022-05-18 NOTE — Progress Notes (Signed)
History & Physical  Primary Care Physician:  Venia Carbon, MD Primary Gastroenterologist: Lucio Edward, MD  CHIEF COMPLAINT:  Personal history of colon polyps   HPI: Johnathan Lambert is a 71 y.o. male  Personal history of adenomatous colon polyps for surveillance colonoscopy.   Past Medical History:  Diagnosis Date   Broken arm 1990   cast   Cataract    FH: colonic polyps    Glaucoma    Gout    Hyperlipidemia    Melanoma of back ([place]) 1/16    Past Surgical History:  Procedure Laterality Date   CATARACT EXTRACTION W/ INTRAOCULAR LENS  IMPLANT, BILATERAL  3/12   both eyes   COLONOSCOPY     CORONARY ARTERY BYPASS GRAFT  09/2019   [facility]   SHOULDER SURGERY  1985   Right    Prior to Admission medications   Medication Sig Start Date End Date Taking? Authorizing Provider  allopurinol (ZYLOPRIM) 300 MG tablet Take 300 mg by mouth daily.   Yes [provider]  aspirin 81 MG EC tablet Take 1 tablet by mouth daily. 11/06/20  Yes [provider]  atorvastatin (LIPITOR) 40 MG tablet TAKE 1 TABLET BY MOUTH EVERY DAY 10/22/21  Yes Venia Carbon, MD  dorzolamide-timolol (COSOPT) 22.3-6.8 MG/ML ophthalmic solution Place 1 drop into both eyes 2 (two) times daily. 03/04/19  Yes [provider]  metoprolol tartrate (LOPRESSOR) 25 MG tablet Take 0.5 tablets by mouth in the morning and at bedtime. 01/04/20  Yes [provider]  Multiple Vitamin (MULTIVITAMIN) tablet Take 1 tablet by mouth daily.     Yes [provider]  sertraline (ZOLOFT) 50 MG tablet TAKE 1 TABLET BY MOUTH EVERY DAY 09/18/21  Yes Venia Carbon, MD  COLCRYS 0.6 MG tablet Take 1 tablet (0.6 mg total) by mouth 2 (two) times daily as needed. Patient not taking: Reported on 04/22/2022 03/21/18   [name], MD  predniSONE (DELTASONE) 20 MG tablet TAKE 2 TABLET BY ORAL ROUTE EVERY DAY FOR 4 DAYS IF GOUT FLARE 05/03/22   [provider]  sildenafil (REVATIO) 20 MG  tablet TAKE 3-5 TABLETS BY MOUTH DAILY AS NEEDED 11/20/21   Venia Carbon, MD    Current Outpatient Medications  Medication Sig Dispense Refill   allopurinol (ZYLOPRIM) 300 MG tablet Take 300 mg by mouth daily.     aspirin 81 MG EC tablet Take 1 tablet by mouth daily.     atorvastatin (LIPITOR) 40 MG tablet TAKE 1 TABLET BY MOUTH EVERY DAY 90 tablet 3   dorzolamide-timolol (COSOPT) 22.3-6.8 MG/ML ophthalmic solution Place 1 drop into both eyes 2 (two) times daily.     metoprolol tartrate (LOPRESSOR) 25 MG tablet Take 0.5 tablets by mouth in the morning and at bedtime.     Multiple Vitamin (MULTIVITAMIN) tablet Take 1 tablet by mouth daily.       sertraline (ZOLOFT) 50 MG tablet TAKE 1 TABLET BY MOUTH EVERY DAY 90 tablet 2   COLCRYS 0.6 MG tablet Take 1 tablet (0.6 mg total) by mouth 2 (two) times daily as needed. (Patient not taking: Reported on 04/22/2022) 90 tablet 1   predniSONE (DELTASONE) 20 MG tablet TAKE 2 TABLET BY ORAL ROUTE EVERY DAY FOR 4 DAYS IF GOUT FLARE     sildenafil (REVATIO) 20 MG tablet TAKE 3-5 TABLETS BY MOUTH DAILY AS NEEDED 50 tablet 11   Current Facility-Administered Medications  Medication Dose Route Frequency Provider Last Rate Last  Admin   0.9 %  sodium chloride infusion  500 mL Intravenous Once Ladene Artist, MD        Allergies as of 05/18/2022   (No Known Allergies)    Family History  Problem Relation Age of Onset   Hyperlipidemia Mother    Hyperthyroidism Mother    Heart disease Father 39       MI and CAD   Hyperthyroidism Sister    Hyperthyroidism Brother    Cancer Paternal Uncle        lung   Colon cancer Neg Hx    Esophageal cancer Neg Hx    Rectal cancer Neg Hx    Stomach cancer Neg Hx    Prostate cancer Neg Hx    Bladder Cancer Neg Hx    Kidney cancer Neg Hx     Social History   Socioeconomic History   Marital status: Married    Spouse name: Not on file   Number of children: 2   Years of education: Not on file   Highest  education level: Not on file  Occupational History   Occupation: Science writer    Comment: [employer]--retired  Tobacco Use   Smoking status: Never   Smokeless tobacco: Never  Vaping Use   Vaping Use: Never used  Substance and Sexual Activity   Alcohol use: Yes    Comment: Occassionally   Drug use: No   Sexual activity: Not on file  Other Topics Concern   Not on file  Social History Narrative   Walks and enjoys golf.       No living will   Wife should make decisions--- alternate is daughter   Would accept resuscitation   Would accept tube feeds---at least temporarily   Social Determinants of Health   Financial Resource Strain: Not on file  Food Insecurity: Not on file  Transportation Needs: Not on file  Physical Activity: Not on file  Stress: Not on file  Social Connections: Not on file  Intimate Partner Violence: Not on file    Review of Systems:  All systems reviewed were negative except where noted in HPI.   Physical Exam: General:  Alert, well-developed, in NAD Head:  Normocephalic and atraumatic. Eyes:  Sclera clear, no icterus.   Conjunctiva pink. Ears:  Normal auditory acuity. Mouth:  No deformity or lesions.  Neck:  Supple; no masses . Lungs:  Clear throughout to auscultation.   No wheezes, crackles, or rhonchi. No acute distress. Heart:  Regular rate and rhythm; no murmurs. Abdomen:  Soft, nondistended, nontender. No masses, hepatomegaly. No obvious masses.  Normal bowel .    Rectal:  Deferred   Msk:  Symmetrical without gross deformities.. Pulses:  Normal pulses noted. Extremities:  Without edema. Neurologic:  Alert and  oriented x4;  grossly normal neurologically. Skin:  Intact without significant lesions or rashes. Psych:  Alert and cooperative. Normal mood and affect.   Impression / Plan:   Personal history of adenomatous colon polyps for surveillance colonoscopy.  [name]. Fuller Plan  05/18/2022, 8:56 AM See Shea Evans, [facility], to contact our on  call provider

## 2022-05-18 NOTE — Progress Notes (Signed)
Called to room to assist during endoscopic procedure.  Patient ID and intended procedure confirmed with present staff. Received instructions for my participation in the procedure from the performing physician.  

## 2022-05-19 ENCOUNTER — Telehealth: Payer: Self-pay

## 2022-05-19 NOTE — Telephone Encounter (Signed)
  Follow up Call-     05/18/2022    8:34 AM  Call back number  Post procedure Call Back phone  # [phone]  Permission to leave phone message Yes     Patient questions:  Do you have a fever, pain , or abdominal swelling? No. Pain Score  0 *  Have you tolerated food without any problems? Yes.    Have you been able to return to your normal activities? Yes.    Do you have any questions about your discharge instructions: Diet   No. Medications  No. Follow up visit  No.  Do you have questions or concerns about your Care? No.  Actions: * If pain score is 4 or above: No action needed, pain <4.

## 2022-05-31 ENCOUNTER — Encounter: Payer: Self-pay | Admitting: Gastroenterology

## 2022-06-26 ENCOUNTER — Other Ambulatory Visit: Payer: Self-pay | Admitting: Internal Medicine

## 2022-07-14 ENCOUNTER — Encounter: Payer: Self-pay | Admitting: Internal Medicine

## 2022-07-14 ENCOUNTER — Ambulatory Visit (INDEPENDENT_AMBULATORY_CARE_PROVIDER_SITE_OTHER): Payer: Medicare Other | Admitting: Internal Medicine

## 2022-07-14 VITALS — BP 108/60 | HR 70 | Temp 97.7°F | Ht 67.75 in | Wt 193.0 lb

## 2022-07-14 DIAGNOSIS — F39 Unspecified mood [affective] disorder: Secondary | ICD-10-CM

## 2022-07-14 DIAGNOSIS — M7500 Adhesive capsulitis of unspecified shoulder: Secondary | ICD-10-CM | POA: Insufficient documentation

## 2022-07-14 DIAGNOSIS — I2510 Atherosclerotic heart disease of native coronary artery without angina pectoris: Secondary | ICD-10-CM

## 2022-07-14 DIAGNOSIS — Z0000 Encounter for general adult medical examination without abnormal findings: Secondary | ICD-10-CM | POA: Diagnosis not present

## 2022-07-14 DIAGNOSIS — M1000 Idiopathic gout, unspecified site: Secondary | ICD-10-CM

## 2022-07-14 DIAGNOSIS — M7501 Adhesive capsulitis of right shoulder: Secondary | ICD-10-CM | POA: Diagnosis not present

## 2022-07-14 LAB — CBC
HCT: 38.6 % — ABNORMAL LOW (ref 39.0–52.0)
Hemoglobin: 13.1 g/dL (ref 13.0–17.0)
MCHC: 33.8 g/dL (ref 30.0–36.0)
MCV: 98.2 fl (ref 78.0–100.0)
Platelets: 197 10*3/uL (ref 150.0–400.0)
RBC: 3.93 Mil/uL — ABNORMAL LOW (ref 4.22–5.81)
RDW: 13.7 % (ref 11.5–15.5)
WBC: 5.6 10*3/uL (ref 4.0–10.5)

## 2022-07-14 LAB — COMPREHENSIVE METABOLIC PANEL
ALT: 22 U/L (ref 0–53)
AST: 29 U/L (ref 0–37)
Albumin: 4.1 g/dL (ref 3.5–5.2)
Alkaline Phosphatase: 59 U/L (ref 39–117)
BUN: 11 mg/dL (ref 6–23)
CO2: 28 mEq/L (ref 19–32)
Calcium: 9 mg/dL (ref 8.4–10.5)
Chloride: 103 mEq/L (ref 96–112)
Creatinine, Ser: 0.65 mg/dL (ref 0.40–1.50)
GFR: 94.91 mL/min (ref >60.00)
Glucose, Bld: 111 mg/dL — ABNORMAL HIGH (ref 70–99)
Potassium: 4 mEq/L (ref 3.5–5.1)
Sodium: 139 mEq/L (ref 135–145)
Total Bilirubin: 0.7 mg/dL (ref 0.2–1.2)
Total Protein: 6.7 g/dL (ref 6.0–8.3)

## 2022-07-14 LAB — LIPID PANEL
Cholesterol: 164 mg/dL (ref 0–200)
HDL: 67 mg/dL (ref >39.00)
NonHDL: 96.83
Total CHOL/HDL Ratio: 2
Triglycerides: 228 mg/dL — ABNORMAL HIGH (ref 0.0–149.0)
VLDL: 45.6 mg/dL — ABNORMAL HIGH (ref 0.0–40.0)

## 2022-07-14 LAB — LDL CHOLESTEROL, DIRECT: Direct LDL: 67 mg/dL

## 2022-07-14 LAB — URIC ACID: Uric Acid, Serum: 3.5 mg/dL — ABNORMAL LOW (ref 4.0–7.8)

## 2022-07-14 NOTE — Assessment & Plan Note (Signed)
Chronic anxiety and mild dysthymia in the past Will continue the sertraline 50 mg daily

## 2022-07-14 NOTE — Addendum Note (Signed)
Addended by: [name] on: 07/14/2022 08:39 AM   Modules accepted: Orders

## 2022-07-14 NOTE — Assessment & Plan Note (Signed)
I have personally reviewed the Medicare Annual Wellness questionnaire and have noted 1. The patient's medical and social history 2. Their use of alcohol, tobacco or illicit drugs 3. Their current medications and supplements 4. The patient's functional ability including ADL's, fall risks, home safety risks and hearing or visual             impairment. 5. Diet and physical activities 6. Evidence for depression or mood disorders  The patients weight, height, BMI and visual acuity have been recorded in the chart I have made referrals, counseling and provided education to the patient based review of the above and I have provided the pt with a written personalized care plan for preventive services.  I have provided you with a copy of your personalized plan for preventive services. Please take the time to review along with your updated medication list.  Colon due again in 2026 Discussed PSA--will stop these Still prefers no immunizations --prevnar, flu, COVID, shingrix Needs to do more exercise

## 2022-07-14 NOTE — Assessment & Plan Note (Signed)
Has been quiet on atorvastatin 40, metoprolol 12.5 bid and ASA 81

## 2022-07-14 NOTE — Assessment & Plan Note (Signed)
Discussed ROM and working on strength--chronic and no acute inflammation Will make ortho referral if worsens

## 2022-07-14 NOTE — Assessment & Plan Note (Signed)
Quiet on allopurinol '300mg'$  daily

## 2022-07-14 NOTE — Progress Notes (Signed)
Subjective:    Patient ID: Johnathan Lambert, male    DOB: 1951/03/05, 72 y.o.   MRN: [id number]  HPI Here for Medicare wellness visit and follow up of chronic health conditions Reviewed advanced directives Reviewed other doctors--Dr Stark--GI, Dr Stann Mainland, Dr Pasi--cardiology, Dr Rutherford Nail, Dr Su--dentist No hospitalizations this year. Had laser procedures on both eyes for glaucoma Vision is okay Hearing is okay Not exercising regularly--discussed Still enjoys daily beer No tobacco No falls Mood is good Independent with instrumental ADLs No sig memory issues  Had long ago right shoulder surgery Stays sore now Hasn't really limited activity  Wife concerned that he stops breathing at times Sleeps well Wakes refreshed and no daytime somnolence  No chest pain--other than a muscle pain on the right side No SOB No dizziness or syncope No edema No palpitations  No depression No anxiety--on the sertraline  Voids okay Flow is okay No Rx for Dupuytren's Satisfied with sildenafil  Current Outpatient Medications on File Prior to Visit  Medication Sig Dispense Refill   allopurinol (ZYLOPRIM) 300 MG tablet Take 300 mg by mouth daily.     aspirin 81 MG EC tablet Take 1 tablet by mouth daily.     atorvastatin (LIPITOR) 40 MG tablet TAKE 1 TABLET BY MOUTH EVERY DAY 90 tablet 3   COLCRYS 0.6 MG tablet Take 1 tablet (0.6 mg total) by mouth 2 (two) times daily as needed. 90 tablet 1   dorzolamide-timolol (COSOPT) 22.3-6.8 MG/ML ophthalmic solution Place 1 drop into both eyes 2 (two) times daily.     metoprolol tartrate (LOPRESSOR) 25 MG tablet Take 0.5 tablets by mouth in the morning and at bedtime.     Multiple Vitamin (MULTIVITAMIN) tablet Take 1 tablet by mouth daily.       sertraline (ZOLOFT) 50 MG tablet TAKE 1 TABLET BY MOUTH EVERY DAY 90 tablet 0   sildenafil (REVATIO) 20 MG tablet TAKE 3-5 TABLETS BY MOUTH DAILY AS NEEDED 50 tablet 11   No current  facility-administered medications on file prior to visit.    No Known Allergies  Past Medical History:  Diagnosis Date   Broken arm 1990   cast   Cataract    FH: colonic polyps    Glaucoma    Gout    Hyperlipidemia    Melanoma of back ([place]) 1/16    Past Surgical History:  Procedure Laterality Date   CATARACT EXTRACTION W/ INTRAOCULAR LENS  IMPLANT, BILATERAL  3/12   both eyes   COLONOSCOPY     CORONARY ARTERY BYPASS GRAFT  09/2019   [facility]   SHOULDER SURGERY  1985   Right    Family History  Problem Relation Age of Onset   Hyperlipidemia Mother    Hyperthyroidism Mother    Heart disease Father 35       MI and CAD   Hyperthyroidism Sister    Hyperthyroidism Brother    Cancer Paternal Uncle        lung   Colon cancer Neg Hx    Esophageal cancer Neg Hx    Rectal cancer Neg Hx    Stomach cancer Neg Hx    Prostate cancer Neg Hx    Bladder Cancer Neg Hx    Kidney cancer Neg Hx     Social History   Socioeconomic History   Marital status: Married    Spouse name: Not on file   Number of children: 2   Years of education: Not on file   Highest  education level: Not on file  Occupational History   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       No living will   Wife should make decisions--- alternate is daughter   Would accept resuscitation   Would accept tube feeds---at least temporarily   Social Determinants of Health   Financial Resource Strain: Not on file  Food Insecurity: Not on file  Transportation Needs: Not on file  Physical Activity: Not on file  Stress: Not on file  Social Connections: Not on  file  Intimate Partner Violence: Not on file   Review of Systems Appetite is good Weight stable Wears seat belt Teeth are okay--keeps up with dentist Has some spots on upper chest to be checked--does see derm No other significant back or joint problems No heartburn or dysphagia Bowels move fine--no blood     Objective:   Physical Exam Constitutional:      Appearance: Normal appearance.  HENT:     Mouth/Throat:     Pharynx: No oropharyngeal exudate or posterior oropharyngeal erythema.  Eyes:     Conjunctiva/sclera: Conjunctivae normal.     Pupils: Pupils are equal, round, and reactive to light.  Cardiovascular:     Rate and Rhythm: Normal rate and regular rhythm.     Pulses: Normal pulses.     Heart sounds:     No gallop.  Pulmonary:     Effort: Pulmonary effort is normal.     Breath sounds: Normal breath sounds. No wheezing or rales.  Abdominal:     Palpations: Abdomen is soft.     Tenderness: There is no abdominal tenderness.  Musculoskeletal:     Cervical back: Neck supple.     Right lower leg: No edema.     Left lower leg: No edema.     Comments: Sig decreased abduction and external rotations of right shoulder  Lymphadenopathy:     Cervical: No cervical adenopathy.  Skin:    Findings: No lesion or rash.  Neurological:     General: No focal deficit present.     Mental Status: He is alert and oriented to person, place, and time.     Comments: Word naming--- 8/1 minute Recall 2/3  Psychiatric:        Mood and Affect: Mood normal.        Behavior: Behavior normal.            Assessment & Plan:

## 2022-07-14 NOTE — Progress Notes (Signed)
Hearing Screening - Comments:: Passed whisper test Vision Screening - Comments:: January 2024

## 2022-08-31 ENCOUNTER — Other Ambulatory Visit: Payer: Self-pay | Admitting: Internal Medicine

## 2022-09-24 ENCOUNTER — Other Ambulatory Visit: Payer: Self-pay | Admitting: Internal Medicine

## 2022-11-26 ENCOUNTER — Other Ambulatory Visit: Payer: Self-pay | Admitting: Internal Medicine

## 2022-11-26 NOTE — Telephone Encounter (Signed)
LAST APPOINTMENT DATE: 07/14/22 CPE    NEXT APPOINTMENT DATE: 07/18/23    LAST REFILL: 11/20/21 #50   QTY: #50 with 11 rf

## 2023-07-18 ENCOUNTER — Encounter: Payer: Self-pay | Admitting: Internal Medicine

## 2023-07-18 ENCOUNTER — Ambulatory Visit (INDEPENDENT_AMBULATORY_CARE_PROVIDER_SITE_OTHER): Payer: Medicare Other | Admitting: Internal Medicine

## 2023-07-18 VITALS — BP 100/64 | HR 68 | Temp 98.0°F | Ht 68.0 in | Wt 193.0 lb

## 2023-07-18 DIAGNOSIS — Z1159 Encounter for screening for other viral diseases: Secondary | ICD-10-CM

## 2023-07-18 DIAGNOSIS — Z0000 Encounter for general adult medical examination without abnormal findings: Secondary | ICD-10-CM

## 2023-07-18 DIAGNOSIS — M1000 Idiopathic gout, unspecified site: Secondary | ICD-10-CM | POA: Diagnosis not present

## 2023-07-18 DIAGNOSIS — I2510 Atherosclerotic heart disease of native coronary artery without angina pectoris: Secondary | ICD-10-CM

## 2023-07-18 DIAGNOSIS — F39 Unspecified mood [affective] disorder: Secondary | ICD-10-CM | POA: Diagnosis not present

## 2023-07-18 LAB — LIPID PANEL
Cholesterol: 143 mg/dL (ref 0–200)
HDL: 62 mg/dL (ref >39.00)
LDL Cholesterol: 60 mg/dL (ref 0–99)
NonHDL: 81.13
Total CHOL/HDL Ratio: 2
Triglycerides: 104 mg/dL (ref 0.0–149.0)
VLDL: 20.8 mg/dL (ref 0.0–40.0)

## 2023-07-18 LAB — CBC
HCT: 39.9 % (ref 39.0–52.0)
Hemoglobin: 13.4 g/dL (ref 13.0–17.0)
MCHC: 33.5 g/dL (ref 30.0–36.0)
MCV: 99.7 fL (ref 78.0–100.0)
Platelets: 257 10*3/uL (ref 150.0–400.0)
RBC: 4 Mil/uL — ABNORMAL LOW (ref 4.22–5.81)
RDW: 13.7 % (ref 11.5–15.5)
WBC: 7.3 10*3/uL (ref 4.0–10.5)

## 2023-07-18 LAB — COMPREHENSIVE METABOLIC PANEL
ALT: 14 U/L (ref 0–53)
AST: 23 U/L (ref 0–37)
Albumin: 3.9 g/dL (ref 3.5–5.2)
Alkaline Phosphatase: 72 U/L (ref 39–117)
BUN: 12 mg/dL (ref 6–23)
CO2: 27 meq/L (ref 19–32)
Calcium: 9 mg/dL (ref 8.4–10.5)
Chloride: 103 meq/L (ref 96–112)
Creatinine, Ser: 0.66 mg/dL (ref 0.40–1.50)
GFR: 93.8 mL/min (ref >60.00)
Glucose, Bld: 104 mg/dL — ABNORMAL HIGH (ref 70–99)
Potassium: 3.9 meq/L (ref 3.5–5.1)
Sodium: 139 meq/L (ref 135–145)
Total Bilirubin: 0.6 mg/dL (ref 0.2–1.2)
Total Protein: 7.2 g/dL (ref 6.0–8.3)

## 2023-07-18 LAB — URIC ACID: Uric Acid, Serum: 3.8 mg/dL — ABNORMAL LOW (ref 4.0–7.8)

## 2023-07-18 NOTE — Assessment & Plan Note (Signed)
Chronic anxiety controlled with sertraline 50 Will continue

## 2023-07-18 NOTE — Assessment & Plan Note (Signed)
No angina On atorvastatin 40, asa 81, metoprolol 12.5 bid

## 2023-07-18 NOTE — Progress Notes (Signed)
Subjective:    Patient ID: Johnathan Lambert, male    DOB: 1950/09/26, 73 y.o.   MRN: [id number]  HPI Here for Medicare wellness visit and follow up of chronic health conditions Reviewed advanced directives Reviewed other doctors---Dr Russella Dar (retiring)--GI, Dr Pasi--cardiology, Dr Candace Cruise, Dr Judithe Modest, Dr Su--dentist No hospitalizations or surgery in the past year Vision is fine Hearing is good Not really exercising--walks when it isn't so cold Drinks beer regularly No tobacco No falls Independent with instrumental ADLs No sig memory issues  Now spending most of his time at the beach Still takes the sertraline--controls his anxiety No depression or anhedonia  Gout has been quiet No recent colchicine  No heart issues No chest pain or SOB No dizziness or syncope No regular leg swelling No palpitations  Current Outpatient Medications on File Prior to Visit  Medication Sig Dispense Refill   allopurinol (ZYLOPRIM) 300 MG tablet Take 300 mg by mouth daily.     aspirin 81 MG EC tablet Take 1 tablet by mouth daily.     atorvastatin (LIPITOR) 40 MG tablet TAKE 1 TABLET BY MOUTH EVERY DAY 90 tablet 3   COLCRYS 0.6 MG tablet Take 1 tablet (0.6 mg total) by mouth 2 (two) times daily as needed. 90 tablet 1   dorzolamide-timolol (COSOPT) 22.3-6.8 MG/ML ophthalmic solution Place 1 drop into both eyes 2 (two) times daily.     metoprolol tartrate (LOPRESSOR) 25 MG tablet Take 0.5 tablets by mouth in the morning and at bedtime.     Multiple Vitamin (MULTIVITAMIN) tablet Take 1 tablet by mouth daily.       sertraline (ZOLOFT) 50 MG tablet TAKE 1 TABLET BY MOUTH EVERY DAY 90 tablet 3   sildenafil (REVATIO) 20 MG tablet TAKE 3-5 TABLETS BY MOUTH DAILY AS NEEDED 50 tablet 11   No current facility-administered medications on file prior to visit.    No Known Allergies  Past Medical History:  Diagnosis Date   Broken arm 1990   cast   Cataract    FH: colonic polyps    Glaucoma     Gout    Hyperlipidemia    Melanoma of back (HCC) 1/16    Past Surgical History:  Procedure Laterality Date   CATARACT EXTRACTION W/ INTRAOCULAR LENS  IMPLANT, BILATERAL  3/12   both eyes   COLONOSCOPY     CORONARY ARTERY BYPASS GRAFT  09/2019   [facility]   SHOULDER SURGERY  1985   Right    Family History  Problem Relation Age of Onset   Hyperlipidemia Mother    Hyperthyroidism Mother    Heart disease Father 40       MI and CAD   Hyperthyroidism Sister    Hyperthyroidism Brother    Cancer Paternal Uncle        lung   Colon cancer Neg Hx    Esophageal cancer Neg Hx    Rectal cancer Neg Hx    Stomach cancer Neg Hx    Prostate cancer Neg Hx    Bladder Cancer Neg Hx    Kidney cancer Neg Hx     Social History   Socioeconomic History   Marital status: Married    Spouse name: Not on file   Number of children: 2   Years of education: Not on file   Highest education level: Not on file  Occupational History   Occupation: Water engineer    Comment: [employer]--retired  Tobacco Use   Smoking status: Never  Passive exposure: Past   Smokeless tobacco: Never  Vaping Use   Vaping status: Never Used  Substance and Sexual Activity   Alcohol use: Yes    Comment: Occassionally   Drug use: No   Sexual activity: Not on file  Other Topics Concern   Not on file  Social History Narrative   Walks and enjoys golf.       No living will   Wife should make decisions--- alternate is daughter   Would accept resuscitation   Would accept tube feeds---at least temporarily   Social Drivers of Health   Financial Resource Strain: Low Risk  (09/28/2019)   Received from [facility], [facility]   Overall Financial Resource Strain (CARDIA)    Difficulty of Paying Living Expenses: Not hard at all  Food Insecurity: No Food Insecurity (09/28/2019)   Received from [facility], [facility]   Hunger Vital Sign    Worried About Running Out of Food in the Last Year: Never true     Ran Out of Food in the Last Year: Never true  Transportation Needs: No Transportation Needs (09/28/2019)   Received from [facility], [facility]   [facility] - Transportation    Lack of Transportation (Medical): No    Lack of Transportation (Non-Medical): No  Physical Activity: Sufficiently Active (09/28/2019)   Received from [facility], [facility]   Exercise Vital Sign    Days of Exercise per Week: 3 days    Minutes of Exercise per Session: 100 min  Stress: No Stress Concern Present (09/28/2019)   Received from [facility], [facility] - Occupational Stress Questionnaire    Feeling of Stress : Not at all  Social Connections: Socially Integrated (09/28/2019)   Received from [facility], [facility]   Social Connection and Isolation Panel [NHANES]    Frequency of Communication with Friends and Family: Three times a week    Frequency of Social Gatherings with Friends and Family: Three times a week    Attends Religious Services: More than 4 times per year    Active Member of Clubs or Organizations: Yes    Attends Banker Meetings: More than 4 times per year    Marital Status: Married  Catering manager Violence: Not At Risk (09/28/2019)   Received from [facility], [facility]   Humiliation, Afraid, Rape, and Kick questionnaire    Fear of Current or Ex-Partner: No    Emotionally Abused: No    Physically Abused: No    Sexually Abused: No   Review of Systems Appetite is fine Weight is stable Sleeps fine Wears seat belt Teeth okay--keeps up with dentist Has some hip pain---if he is walking on the beach No heartburn or dysphagia Bowels move okay--no blood Voids fine--good stream. No dribbling Some right shoulder pain still--may be worse due to moving and lots of boxes, etc No suspicious skin lesions    Objective:   Physical Exam Constitutional:      Appearance: Normal appearance.   HENT:     Mouth/Throat:     Pharynx: No oropharyngeal exudate or posterior oropharyngeal erythema.  Eyes:     Conjunctiva/sclera: Conjunctivae normal.     Pupils: Pupils are equal, round, and reactive to light.  Cardiovascular:     Rate and Rhythm: Normal rate and regular rhythm.     Pulses: Normal pulses.  Heart sounds: No murmur heard.    No gallop.  Pulmonary:     Effort: Pulmonary effort is normal.     Breath sounds: Normal breath sounds. No wheezing or rales.  Abdominal:     Palpations: Abdomen is soft.     Tenderness: There is no abdominal tenderness.  Musculoskeletal:     Cervical back: Neck supple.     Right lower leg: No edema.     Left lower leg: No edema.  Lymphadenopathy:     Cervical: No cervical adenopathy.  Skin:    Findings: No lesion or rash.  Neurological:     General: No focal deficit present.     Mental Status: He is alert and oriented to person, place, and time.     Comments: Mini--cog--normal  Psychiatric:        Mood and Affect: Mood normal.        Behavior: Behavior normal.            Assessment & Plan:

## 2023-07-18 NOTE — Assessment & Plan Note (Signed)
I have personally reviewed the Medicare Annual Wellness questionnaire and have noted 1. The patient's medical and social history 2. Their use of alcohol, tobacco or illicit drugs 3. Their current medications and supplements 4. The patient's functional ability including ADL's, fall risks, home safety risks and hearing or visual             impairment. 5. Diet and physical activities 6. Evidence for depression or mood disorders  The patients weight, height, BMI and visual acuity have been recorded in the chart I have made referrals, counseling and provided education to the patient based review of the above and I have provided the pt with a written personalized care plan for preventive services.  I have provided you with a copy of your personalized plan for preventive services. Please take the time to review along with your updated medication list.  Still prefers no immunizations---discussed a Td if any injury Next colon due 12/26 No PSA due to age Is doing some exercise--add resistance

## 2023-07-18 NOTE — Assessment & Plan Note (Signed)
Quiet on the allopurinol 

## 2023-07-18 NOTE — Progress Notes (Signed)
 Hearing Screening - Comments:: Passed whisper test Vision Screening - Comments:: August 2024

## 2023-07-19 LAB — HEPATITIS C ANTIBODY: Hepatitis C Ab: NONREACTIVE

## 2023-11-09 ENCOUNTER — Other Ambulatory Visit: Payer: Self-pay | Admitting: Internal Medicine

## 2023-11-18 ENCOUNTER — Other Ambulatory Visit: Payer: Self-pay | Admitting: Internal Medicine

## 2023-11-18 MED ORDER — SILDENAFIL CITRATE 20 MG PO TABS: ORAL_TABLET | ORAL | 11 refills | Status: DC

## 2023-11-18 NOTE — Telephone Encounter (Signed)
 Copied from CRM [id number]. Topic: Clinical - Medication Refill >> Nov 18, 2023  9:48 AM Trula Gable C wrote: Medication: sildenafil  (REVATIO ) 20 MG tablet   Has the patient contacted their pharmacy? Yes (Agent: If no, request that the patient contact the pharmacy for the refill. If patient does not wish to contact the pharmacy document the reason why and proceed with request.) (Agent: If yes, when and what did the pharmacy advise?)  This is the patient's preferred pharmacy:   [facility] [id number] - [place], [place] - [address] AT [facility] [address] [place] [address] Phone: [phone] Fax: [phone]  Is this the correct pharmacy for this prescription? Yes If no, delete pharmacy and type the correct one.   Has the prescription been filled recently? No  Is the patient out of the medication? Yes  Has the patient been seen for an appointment in the last year OR does the patient have an upcoming appointment? Yes  Can we respond through MyChart? Yes  Agent: Please be advised that Rx refills may take up to 3 business days. We ask that you follow-up with your pharmacy.

## 2023-11-24 ENCOUNTER — Other Ambulatory Visit (HOSPITAL_COMMUNITY): Payer: Self-pay

## 2023-11-24 ENCOUNTER — Telehealth: Payer: Self-pay

## 2023-11-24 NOTE — Telephone Encounter (Signed)
 Pharmacy Patient Advocate Encounter   Received notification from CoverMyMeds that prior authorization for Sildenafil  Citrate (PAH) 20MG  tablets is required/requested.   Insurance verification completed.   The patient is insured through [facility] MEDICARE .   Per test claim: PA required; PA submitted to above mentioned insurance via CoverMyMeds Key/confirmation #/EOC [id number] Status is pending

## 2023-11-25 NOTE — Telephone Encounter (Signed)
 Pharmacy Patient Advocate Encounter  Received notification from [facility] that Prior Authorization for SILDENAFIL  20MG  TABS has been DENIED.  Full denial letter will be uploaded to the media tab. See denial reason below.   PA #/Case ID/Reference #: [id number]

## 2023-12-01 ENCOUNTER — Telehealth: Payer: Self-pay

## 2023-12-01 MED ORDER — SILDENAFIL CITRATE 20 MG PO TABS: ORAL_TABLET | ORAL | 11 refills | Status: AC

## 2023-12-01 NOTE — Telephone Encounter (Signed)
 Spoke to pt. Apologized that it was sent to the wrong pharmacy. Sent rx to [facility] as he requested.

## 2023-12-01 NOTE — Addendum Note (Signed)
 Addended by: Franne Ivory on: 12/01/2023 04:40 PM   Modules accepted: Orders

## 2023-12-01 NOTE — Telephone Encounter (Signed)
 Copied from CRM [id number]. Topic: Clinical - Prescription Issue >> Dec 01, 2023 10:22 AM Dewanda Foots wrote: Reason for CRM: Pt states this medication should have been sent to the [facility] pharmacy and not [facility].  sildenafil  (REVATIO ) 20 MG tablet  [facility] [id number] - [place], [place] - [address] AT [facility] [address] [place] [address] Phone: [phone] Fax: [phone] Hours: Not open 24 hours  He is going to be gone for 2 weeks and needs this refilled please.   Please call patient when this is corrected-[phone]
